# Patient Record
Sex: Male | Born: 1965 | Race: Black or African American | Hispanic: No | Marital: Single | Smoking: Current every day smoker
Health system: Southern US, Community
[De-identification: ages and names within clinical notes are randomized; demographics above are authoritative.]

## PROBLEM LIST (undated history)

## (undated) DIAGNOSIS — B20 Human immunodeficiency virus [HIV] disease: Secondary | ICD-10-CM

---

## 2006-01-19 ENCOUNTER — Encounter (INDEPENDENT_AMBULATORY_CARE_PROVIDER_SITE_OTHER): Payer: Self-pay | Admitting: *Deleted

## 2006-01-19 ENCOUNTER — Encounter: Admission: RE | Admit: 2006-01-19 | Discharge: 2006-01-19 | Payer: Self-pay | Admitting: Internal Medicine

## 2006-01-19 ENCOUNTER — Ambulatory Visit: Payer: Self-pay | Admitting: Internal Medicine

## 2006-02-22 ENCOUNTER — Ambulatory Visit: Payer: Self-pay | Admitting: Internal Medicine

## 2006-03-03 DIAGNOSIS — B20 Human immunodeficiency virus [HIV] disease: Secondary | ICD-10-CM

## 2006-03-22 ENCOUNTER — Encounter (INDEPENDENT_AMBULATORY_CARE_PROVIDER_SITE_OTHER): Payer: Self-pay | Admitting: *Deleted

## 2006-03-22 ENCOUNTER — Ambulatory Visit: Payer: Self-pay | Admitting: Internal Medicine

## 2006-03-22 ENCOUNTER — Encounter: Admission: RE | Admit: 2006-03-22 | Discharge: 2006-03-22 | Payer: Self-pay | Admitting: Internal Medicine

## 2006-03-22 LAB — CONVERTED CEMR LAB
CD4 Count: 410 microliters
HIV 1 RNA Quant: 157 copies/mL

## 2006-04-05 ENCOUNTER — Ambulatory Visit: Payer: Self-pay | Admitting: Internal Medicine

## 2006-05-18 ENCOUNTER — Encounter: Payer: Self-pay | Admitting: Internal Medicine

## 2006-05-18 ENCOUNTER — Encounter (INDEPENDENT_AMBULATORY_CARE_PROVIDER_SITE_OTHER): Payer: Self-pay | Admitting: Infectious Diseases

## 2006-06-13 ENCOUNTER — Encounter: Payer: Self-pay | Admitting: Internal Medicine

## 2006-06-21 ENCOUNTER — Encounter: Admission: RE | Admit: 2006-06-21 | Discharge: 2006-06-21 | Payer: Self-pay | Admitting: Internal Medicine

## 2006-06-21 ENCOUNTER — Encounter (INDEPENDENT_AMBULATORY_CARE_PROVIDER_SITE_OTHER): Payer: Self-pay | Admitting: *Deleted

## 2006-06-21 ENCOUNTER — Ambulatory Visit: Payer: Self-pay | Admitting: Internal Medicine

## 2006-06-21 LAB — CONVERTED CEMR LAB
ALT: 13 units/L (ref 0–53)
Basophils Absolute: 0 10*3/uL (ref 0.0–0.1)
CD4 Count: 550 microliters
CO2: 28 meq/L (ref 19–32)
Calcium: 8.8 mg/dL (ref 8.4–10.5)
Chloride: 103 meq/L (ref 96–112)
Creatinine, Ser: 0.88 mg/dL (ref 0.40–1.50)
Eosinophils Relative: 3 % (ref 0–5)
Glucose, Bld: 84 mg/dL (ref 70–99)
HCT: 39.5 % (ref 39.0–52.0)
HIV 1 RNA Quant: <50 copies/mL (ref <50)
HIV-1 RNA Quant, Log: <1.7 (ref <1.70)
Hemoglobin: 13.3 g/dL (ref 13.0–17.0)
Lymphocytes Relative: 45 % (ref 12–46)
Lymphs Abs: 2.8 10*3/uL (ref 0.7–3.3)
Monocytes Absolute: 0.9 10*3/uL — ABNORMAL HIGH (ref 0.2–0.7)
Monocytes Relative: 15 % — ABNORMAL HIGH (ref 3–11)
Neutro Abs: 2.3 10*3/uL (ref 1.7–7.7)
RBC: 4.39 M/uL (ref 4.22–5.81)
Total Bilirubin: 0.4 mg/dL (ref 0.3–1.2)
WBC: 6.3 10*3/uL (ref 4.0–10.5)

## 2006-07-10 ENCOUNTER — Encounter (INDEPENDENT_AMBULATORY_CARE_PROVIDER_SITE_OTHER): Payer: Self-pay | Admitting: *Deleted

## 2006-07-10 LAB — CONVERTED CEMR LAB

## 2006-07-21 ENCOUNTER — Telehealth (INDEPENDENT_AMBULATORY_CARE_PROVIDER_SITE_OTHER): Payer: Self-pay | Admitting: Infectious Diseases

## 2006-07-23 ENCOUNTER — Encounter (INDEPENDENT_AMBULATORY_CARE_PROVIDER_SITE_OTHER): Payer: Self-pay | Admitting: *Deleted

## 2006-08-07 ENCOUNTER — Telehealth: Payer: Self-pay | Admitting: Internal Medicine

## 2006-08-22 ENCOUNTER — Telehealth: Payer: Self-pay | Admitting: Internal Medicine

## 2006-09-19 ENCOUNTER — Telehealth: Payer: Self-pay | Admitting: Internal Medicine

## 2006-10-04 ENCOUNTER — Ambulatory Visit: Payer: Self-pay | Admitting: Internal Medicine

## 2006-10-04 ENCOUNTER — Encounter: Admission: RE | Admit: 2006-10-04 | Discharge: 2006-10-04 | Payer: Self-pay | Admitting: Internal Medicine

## 2006-10-04 LAB — CONVERTED CEMR LAB
ALT: 9 units/L (ref 0–53)
AST: 12 units/L (ref 0–37)
Albumin: 4.5 g/dL (ref 3.5–5.2)
Alkaline Phosphatase: 57 units/L (ref 39–117)
Basophils Relative: 1 % (ref 0–1)
Calcium: 9.4 mg/dL (ref 8.4–10.5)
Chloride: 103 meq/L (ref 96–112)
Eosinophils Absolute: 0.2 10*3/uL (ref 0.0–0.7)
MCHC: 34.6 g/dL (ref 30.0–36.0)
MCV: 88.7 fL (ref 78.0–100.0)
Neutro Abs: 1.6 10*3/uL — ABNORMAL LOW (ref 1.7–7.7)
Neutrophils Relative %: 37 % — ABNORMAL LOW (ref 43–77)
Platelets: 217 10*3/uL (ref 150–400)
Potassium: 4.2 meq/L (ref 3.5–5.3)
RBC: 4.5 M/uL (ref 4.22–5.81)
Sodium: 138 meq/L (ref 135–145)
Total Protein: 8.5 g/dL — ABNORMAL HIGH (ref 6.0–8.3)
WBC: 4.5 10*3/uL (ref 4.0–10.5)

## 2006-10-16 ENCOUNTER — Encounter (INDEPENDENT_AMBULATORY_CARE_PROVIDER_SITE_OTHER): Payer: Self-pay | Admitting: *Deleted

## 2006-10-18 ENCOUNTER — Ambulatory Visit: Payer: Self-pay | Admitting: Internal Medicine

## 2006-10-18 DIAGNOSIS — M549 Dorsalgia, unspecified: Secondary | ICD-10-CM | POA: Insufficient documentation

## 2006-10-19 ENCOUNTER — Telehealth: Payer: Self-pay | Admitting: Internal Medicine

## 2006-11-16 ENCOUNTER — Telehealth: Payer: Self-pay | Admitting: Internal Medicine

## 2006-12-15 ENCOUNTER — Telehealth: Payer: Self-pay | Admitting: Internal Medicine

## 2006-12-22 ENCOUNTER — Encounter: Payer: Self-pay | Admitting: Internal Medicine

## 2007-01-17 ENCOUNTER — Telehealth: Payer: Self-pay | Admitting: Internal Medicine

## 2007-02-01 ENCOUNTER — Telehealth: Payer: Self-pay | Admitting: Internal Medicine

## 2007-02-21 ENCOUNTER — Telehealth: Payer: Self-pay | Admitting: Internal Medicine

## 2007-03-06 ENCOUNTER — Encounter: Admission: RE | Admit: 2007-03-06 | Discharge: 2007-03-06 | Payer: Self-pay | Admitting: Internal Medicine

## 2007-03-06 ENCOUNTER — Ambulatory Visit: Payer: Self-pay | Admitting: Internal Medicine

## 2007-03-06 LAB — CONVERTED CEMR LAB
AST: 13 units/L (ref 0–37)
BUN: 9 mg/dL (ref 6–23)
Basophils Relative: 1 % (ref 0–1)
Calcium: 9.2 mg/dL (ref 8.4–10.5)
Chloride: 103 meq/L (ref 96–112)
Creatinine, Ser: 0.81 mg/dL (ref 0.40–1.50)
Eosinophils Absolute: 0.2 10*3/uL (ref 0.0–0.7)
Eosinophils Relative: 5 % (ref 0–5)
HCT: 40 % (ref 39.0–52.0)
Hemoglobin: 13.7 g/dL (ref 13.0–17.0)
MCHC: 34.3 g/dL (ref 30.0–36.0)
MCV: 88.9 fL (ref 78.0–100.0)
Monocytes Absolute: 0.5 10*3/uL (ref 0.2–0.7)
Monocytes Relative: 11 % (ref 3–11)
Neutro Abs: 2.1 10*3/uL (ref 1.7–7.7)
RBC: 4.5 M/uL (ref 4.22–5.81)
Total Bilirubin: 0.7 mg/dL (ref 0.3–1.2)

## 2007-03-19 ENCOUNTER — Telehealth: Payer: Self-pay | Admitting: Internal Medicine

## 2007-03-21 ENCOUNTER — Ambulatory Visit: Payer: Self-pay | Admitting: Internal Medicine

## 2007-03-21 DIAGNOSIS — F341 Dysthymic disorder: Secondary | ICD-10-CM

## 2007-03-23 ENCOUNTER — Telehealth: Payer: Self-pay | Admitting: Internal Medicine

## 2007-04-18 ENCOUNTER — Telehealth: Payer: Self-pay | Admitting: Internal Medicine

## 2007-05-15 ENCOUNTER — Encounter: Payer: Self-pay | Admitting: Internal Medicine

## 2007-05-25 ENCOUNTER — Telehealth: Payer: Self-pay | Admitting: Internal Medicine

## 2007-06-21 ENCOUNTER — Telehealth: Payer: Self-pay | Admitting: Internal Medicine

## 2007-07-03 ENCOUNTER — Encounter (INDEPENDENT_AMBULATORY_CARE_PROVIDER_SITE_OTHER): Payer: Self-pay | Admitting: *Deleted

## 2007-07-19 ENCOUNTER — Telehealth: Payer: Self-pay | Admitting: Internal Medicine

## 2007-08-31 ENCOUNTER — Telehealth (INDEPENDENT_AMBULATORY_CARE_PROVIDER_SITE_OTHER): Payer: Self-pay | Admitting: *Deleted

## 2007-09-10 ENCOUNTER — Telehealth: Payer: Self-pay | Admitting: Internal Medicine

## 2008-02-07 ENCOUNTER — Telehealth: Payer: Self-pay | Admitting: Internal Medicine

## 2008-06-06 ENCOUNTER — Telehealth (INDEPENDENT_AMBULATORY_CARE_PROVIDER_SITE_OTHER): Payer: Self-pay | Admitting: *Deleted

## 2008-08-26 ENCOUNTER — Ambulatory Visit: Payer: Self-pay | Admitting: Internal Medicine

## 2008-08-26 ENCOUNTER — Encounter (INDEPENDENT_AMBULATORY_CARE_PROVIDER_SITE_OTHER): Payer: Self-pay | Admitting: Licensed Clinical Social Worker

## 2008-08-26 LAB — CONVERTED CEMR LAB
AST: 14 units/L (ref 0–37)
Albumin: 4 g/dL (ref 3.5–5.2)
Alkaline Phosphatase: 51 units/L (ref 39–117)
Basophils Absolute: 0.1 10*3/uL (ref 0.0–0.1)
Eosinophils Absolute: 0.4 10*3/uL (ref 0.0–0.7)
Eosinophils Relative: 11 % — ABNORMAL HIGH (ref 0–5)
GFR calc non Af Amer: >60 mL/min (ref >60)
Glucose, Bld: 82 mg/dL (ref 70–99)
HCT: 38.6 % — ABNORMAL LOW (ref 39.0–52.0)
HIV-1 RNA Quant, Log: 3.09 — ABNORMAL HIGH (ref <1.68)
LDL Cholesterol: 62 mg/dL (ref 0–99)
MCV: 85.6 fL (ref 78.0–100.0)
Platelets: 147 10*3/uL — ABNORMAL LOW (ref 150–400)
Potassium: 4 meq/L (ref 3.5–5.3)
RDW: 14.7 % (ref 11.5–15.5)
Sodium: 138 meq/L (ref 135–145)
Total Protein: 7.6 g/dL (ref 6.0–8.3)

## 2008-09-09 ENCOUNTER — Ambulatory Visit: Payer: Self-pay | Admitting: Internal Medicine

## 2008-09-16 ENCOUNTER — Telehealth (INDEPENDENT_AMBULATORY_CARE_PROVIDER_SITE_OTHER): Payer: Self-pay | Admitting: *Deleted

## 2008-09-16 ENCOUNTER — Encounter (INDEPENDENT_AMBULATORY_CARE_PROVIDER_SITE_OTHER): Payer: Self-pay | Admitting: *Deleted

## 2008-11-26 ENCOUNTER — Ambulatory Visit: Payer: Self-pay | Admitting: Internal Medicine

## 2008-11-26 DIAGNOSIS — L738 Other specified follicular disorders: Secondary | ICD-10-CM

## 2008-11-26 LAB — CONVERTED CEMR LAB
ALT: 10 units/L (ref 0–53)
Alkaline Phosphatase: 51 units/L (ref 39–117)
Basophils Relative: 1 % (ref 0–1)
Eosinophils Absolute: 0.4 10*3/uL (ref 0.0–0.7)
Eosinophils Relative: 8 % — ABNORMAL HIGH (ref 0–5)
Glucose, Bld: 79 mg/dL (ref 70–99)
MCHC: 34.2 g/dL (ref 30.0–36.0)
MCV: 84.9 fL (ref 78.0–100.0)
Monocytes Relative: 6 % (ref 3–12)
Neutrophils Relative %: 35 % — ABNORMAL LOW (ref 43–77)
RBC: 4.17 M/uL — ABNORMAL LOW (ref 4.22–5.81)
Sodium: 140 meq/L (ref 135–145)
Total Bilirubin: 0.3 mg/dL (ref 0.3–1.2)
Total Protein: 7.3 g/dL (ref 6.0–8.3)

## 2008-12-10 ENCOUNTER — Ambulatory Visit: Payer: Self-pay | Admitting: Internal Medicine

## 2008-12-30 ENCOUNTER — Ambulatory Visit: Payer: Self-pay | Admitting: Internal Medicine

## 2009-02-10 ENCOUNTER — Ambulatory Visit: Payer: Self-pay | Admitting: Internal Medicine

## 2009-02-10 LAB — CONVERTED CEMR LAB
Albumin: 3.7 g/dL (ref 3.5–5.2)
BUN: 8 mg/dL (ref 6–23)
CO2: 24 meq/L (ref 19–32)
Calcium: 8.6 mg/dL (ref 8.4–10.5)
Chloride: 108 meq/L (ref 96–112)
Creatinine, Ser: 0.86 mg/dL (ref 0.40–1.50)
Eosinophils Absolute: 0.6 10*3/uL (ref 0.0–0.7)
Eosinophils Relative: 11 % — ABNORMAL HIGH (ref 0–5)
Glucose, Bld: 87 mg/dL (ref 70–99)
HCT: 34 % — ABNORMAL LOW (ref 39.0–52.0)
HIV 1 RNA Quant: 60 copies/mL — ABNORMAL HIGH (ref <48)
Hemoglobin: 11.7 g/dL — ABNORMAL LOW (ref 13.0–17.0)
Lymphs Abs: 2.1 10*3/uL (ref 0.7–4.0)
MCV: 84.4 fL (ref >=78.0)
Monocytes Absolute: 0.5 10*3/uL (ref 0.1–1.0)
Monocytes Relative: 11 % (ref 3–12)
Platelets: 171 10*3/uL (ref 150–400)
Potassium: 3.9 meq/L (ref 3.5–5.3)
RBC: 4.03 M/uL — ABNORMAL LOW (ref 4.22–5.81)
WBC: 4.8 10*3/uL (ref 4.0–10.5)

## 2009-02-24 ENCOUNTER — Ambulatory Visit: Payer: Self-pay | Admitting: Internal Medicine

## 2009-06-18 ENCOUNTER — Ambulatory Visit: Payer: Self-pay | Admitting: Internal Medicine

## 2009-06-18 LAB — CONVERTED CEMR LAB
ALT: <8 units/L (ref 0–53)
AST: 12 units/L (ref 0–37)
Basophils Relative: 1 % (ref 0–1)
CO2: 27 meq/L (ref 19–32)
Creatinine, Ser: 0.83 mg/dL (ref 0.40–1.50)
Eosinophils Absolute: 0.3 10*3/uL (ref 0.0–0.7)
HIV 1 RNA Quant: 66 copies/mL — ABNORMAL HIGH (ref <48)
Lymphs Abs: 2.2 10*3/uL (ref 0.7–4.0)
MCHC: 35.4 g/dL (ref 30.0–36.0)
Monocytes Relative: 7 % (ref 3–12)
Neutro Abs: 2.6 10*3/uL (ref 1.7–7.7)
Neutrophils Relative %: 47 % (ref 43–77)
Platelets: 165 10*3/uL (ref 150–400)
RBC: 4.32 M/uL (ref 4.22–5.81)
Sodium: 139 meq/L (ref 135–145)
Total Bilirubin: 0.4 mg/dL (ref 0.3–1.2)
Total Protein: 7.9 g/dL (ref 6.0–8.3)
WBC: 5.6 10*3/uL (ref 4.0–10.5)

## 2009-07-03 ENCOUNTER — Encounter: Payer: Self-pay | Admitting: Internal Medicine

## 2009-07-07 ENCOUNTER — Ambulatory Visit: Payer: Self-pay | Admitting: Internal Medicine

## 2009-07-24 ENCOUNTER — Encounter: Payer: Self-pay | Admitting: Internal Medicine

## 2009-08-24 ENCOUNTER — Encounter (INDEPENDENT_AMBULATORY_CARE_PROVIDER_SITE_OTHER): Payer: Self-pay | Admitting: *Deleted

## 2009-11-05 ENCOUNTER — Ambulatory Visit: Payer: Self-pay | Admitting: Internal Medicine

## 2009-11-05 LAB — CONVERTED CEMR LAB
Alkaline Phosphatase: 49 units/L (ref 39–117)
BUN: 11 mg/dL (ref 6–23)
Basophils Absolute: 0 10*3/uL (ref 0.0–0.1)
Basophils Relative: 1 % (ref 0–1)
CO2: 23 meq/L (ref 19–32)
Creatinine, Ser: 0.83 mg/dL (ref 0.40–1.50)
Eosinophils Absolute: 0.4 10*3/uL (ref 0.0–0.7)
Eosinophils Relative: 7 % — ABNORMAL HIGH (ref 0–5)
Glucose, Bld: 107 mg/dL — ABNORMAL HIGH (ref 70–99)
HCT: 35.1 % — ABNORMAL LOW (ref 39.0–52.0)
HIV 1 RNA Quant: <48 copies/mL (ref <48)
HIV-1 RNA Quant, Log: <1.68 (ref <1.68)
Hemoglobin: 12 g/dL — ABNORMAL LOW (ref 13.0–17.0)
MCHC: 34.2 g/dL (ref 30.0–36.0)
MCV: 88.4 fL (ref 78.0–100.0)
Monocytes Absolute: 0.4 10*3/uL (ref 0.1–1.0)
Monocytes Relative: 7 % (ref 3–12)
RBC: 3.97 M/uL — ABNORMAL LOW (ref 4.22–5.81)
RDW: 14.7 % (ref 11.5–15.5)
Sodium: 138 meq/L (ref 135–145)
Total Bilirubin: 0.6 mg/dL (ref 0.3–1.2)
Total Protein: 7 g/dL (ref 6.0–8.3)

## 2009-11-25 ENCOUNTER — Encounter (INDEPENDENT_AMBULATORY_CARE_PROVIDER_SITE_OTHER): Payer: Self-pay | Admitting: *Deleted

## 2009-11-25 ENCOUNTER — Ambulatory Visit: Payer: Self-pay | Admitting: Internal Medicine

## 2009-11-26 ENCOUNTER — Telehealth (INDEPENDENT_AMBULATORY_CARE_PROVIDER_SITE_OTHER): Payer: Self-pay | Admitting: *Deleted

## 2010-02-05 ENCOUNTER — Encounter: Payer: Self-pay | Admitting: Internal Medicine

## 2010-03-23 ENCOUNTER — Ambulatory Visit: Payer: Self-pay | Admitting: Internal Medicine

## 2010-03-23 LAB — CONVERTED CEMR LAB
ALT: <8 units/L (ref 0–53)
AST: 11 units/L (ref 0–37)
Basophils Absolute: 0 10*3/uL (ref 0.0–0.1)
Calcium: 9.2 mg/dL (ref 8.4–10.5)
Chloride: 105 meq/L (ref 96–112)
Creatinine, Ser: 0.89 mg/dL (ref 0.40–1.50)
Eosinophils Absolute: 0.5 10*3/uL (ref 0.0–0.7)
Eosinophils Relative: 9 % — ABNORMAL HIGH (ref 0–5)
HCT: 35.9 % — ABNORMAL LOW (ref 39.0–52.0)
HIV 1 RNA Quant: <20 copies/mL (ref <20)
HIV-1 RNA Quant, Log: <1.3 (ref <1.30)
MCV: 87.8 fL (ref 78.0–100.0)
Neutrophils Relative %: 35 % — ABNORMAL LOW (ref 43–77)
Platelets: 147 10*3/uL — ABNORMAL LOW (ref 150–400)
RDW: 14.8 % (ref 11.5–15.5)
Sodium: 140 meq/L (ref 135–145)
Total Bilirubin: 0.4 mg/dL (ref 0.3–1.2)
Total Protein: 6.9 g/dL (ref 6.0–8.3)

## 2010-05-03 ENCOUNTER — Encounter (INDEPENDENT_AMBULATORY_CARE_PROVIDER_SITE_OTHER): Payer: Self-pay | Admitting: *Deleted

## 2010-06-15 NOTE — Letter (Signed)
Summary: Dennis Mathis: Income Verification  Dennis Mathis: Income Verification   Imported By: Florinda Marker 08/06/2009 16:05:02  _____________________________________________________________________  External Attachment:    Type:   Image     Comment:   External Document

## 2010-06-15 NOTE — Progress Notes (Signed)
Summary: RW form not complete  Phone Note Outgoing Call   Call placed by: Paulo Fruit  BS,CPht II,MPH,  November 26, 2009 2:41 PM Call placed to: Patient Reason for Call: Get patient information Details for Reason: discrepancy in SSN Summary of Call: Called patient to verify his SSN.  The number he wrote on the RW form is totatlly different from the one in his chart.  Patient said the 145---SSN is the one he uses in [place].  Patient read off the SSN he wrote on the form.  I asked patient to bring in his SSN card with the correct number.  At the present time I am unable to give him a RW card until this is rectified. Initial call taken by: Paulo Fruit  BS,CPht II,MPH,  November 26, 2009 2:45 PM     Appended Document: RW form not complete Patient came in today to bring his Social Security Card since there were discrepancies in his SSN.  The correct number was entired in [employer] by the manager of the clinic.  A copy of his card will be scanned in the EMR for the future.  RW application is now complete.  Patient should receive his RW card in a few days.

## 2010-06-15 NOTE — Assessment & Plan Note (Signed)
Summary: CHECKUP/SB.   CC:  follow-up visit.  History of Present Illness: Pt here for f/u.  He has been feeling well.  No missed doses of his HIV meds.  Preventive Screening-Counseling & Management  Alcohol-Tobacco     Alcohol drinks/day: on weekends      Alcohol type: beer     Smoking Status: current     Packs/Day: 2 cigs     Cigars/week: 1-2  Caffeine-Diet-Exercise     Caffeine use/day: soda     Does Patient Exercise: yes     Type of exercise: walking     Exercise (avg: min/session): 30-60     Times/week: 3  Hep-HIV-STD-Contraception     HIV Risk: risk noted  Safety-Violence-Falls     Seat Belt Use: yes   Updated Prior Medication List: TRIAMCINOLONE ACETONIDE 0.1 % CREA (TRIAMCINOLONE ACETONIDE) apply two times a day as needed TRUVADA 200-300 MG TABS (EMTRICITABINE-TENOFOVIR) Take 1 tablet by mouth once a day ISENTRESS 400 MG TABS (RALTEGRAVIR POTASSIUM) Take 1 tablet by mouth two times a day VICODIN 5-500 MG TABS (HYDROCODONE-ACETAMINOPHEN) Take 1 tablet by mouth every 6 hours as needed  Current Allergies (reviewed today): No known allergies  Review of Systems  The patient denies anorexia, fever, and weight loss.    Vital Signs:  Patient profile:   45 year old male Height:      67 inches (170.18 cm) Weight:      129.4 pounds (58.82 kg) BMI:     20.34 Temp:     97.3 degrees F oral Pulse rate:   66 / minute BP sitting:   102 / 68  (left arm)  Vitals Entered By: Wendall Mola CMA Duncan Dull) (July 07, 2009 3:49 PM) CC: follow-up visit Is Patient Diabetic? No Pain Assessment Patient in pain? no      Nutritional Status BMI of 19 -24 = normal Nutritional Status Detail appetite is "so-so" per patient  Does patient need assistance? Functional Status Self care Ambulation Normal   Physical Exam  General:  alert, well-developed, well-nourished, and well-hydrated.   Head:  normocephalic and atraumatic.   Mouth:  pharynx pink and moist.   Lungs:   normal breath sounds.      Impression & Recommendations:  Problem # 1:  HIV DISEASE (ICD-042) Pt.s most recent CD4ct was 630 and VL 66 .  Pt instructed to continue the current antiretroviral regimen.  Pt encouraged to take medication regularly and not miss doses.  Pt will f/u in 3 months for repeat blood work and will see me 2 weeks later.  Diagnostics Reviewed:  HIV: CDC-defined AIDS (07/03/2009)   CD4: 630 (06/19/2009)   WBC: 5.6 (06/18/2009)   Hgb: 13.3 (06/18/2009)   HCT: 37.6 (06/18/2009)   Platelets: 165 (06/18/2009) HIV genotype: * (12/10/2008)   HIV-1 RNA: 66 (06/18/2009)   HBSAg: NO (07/10/2006)  Other Orders: Est. Patient Level III (16109) Future Orders: T-CD4SP ([facility]) (CD4SP) ... 10/05/2009 T-HIV Viral Load [id number]) ... 10/05/2009 T-Comprehensive Metabolic Panel [id number]) ... 10/05/2009 T-CBC w/Diff ([id number]) ... 10/05/2009  Patient Instructions: 1)  Please schedule a follow-up appointment in 3 months, 2 weeks after labs.  Process Orders Check Orders Results:     [facility]: ABN not required for this insurance Tests Sent for requisitioning (July 07, 2009 4:05 PM):     10/05/2009: [facility] -- T-HIV Viral Load [id number] (signed)     10/05/2009: [facility] -- T-Comprehensive Metabolic Panel [[id number]] (signed)     10/05/2009:  [facility] -- [facility] w/Diff [phone] (signed)

## 2010-06-15 NOTE — Miscellaneous (Signed)
  Clinical Lists Changes  Orders: Added new Test order of T-CBC w/Diff [id number]) - Signed Added new Test order of T-Comprehensive Metabolic Panel [phone]) - Signed Added new Test order of T-HIV Viral Load [id number]) - Signed Added new Test order of T-CD4SP [facility]) (CD4SP) - Signed

## 2010-06-15 NOTE — Assessment & Plan Note (Signed)
Summary: f/u [mkj]   CC:  follow-up visit, lab results, and c/o depression.  History of Present Illness: Pt here for f/u.  He has been sruggling with depression because 7/4 is the anniversary of when his partner died and he found out she was HIV positive and then was tested and found out he was positive. He has a good support system and does not feel he needs counseling.  He would like to get bakc on antidepressants.  Depression History:      The patient denies a depressed mood most of the day and a diminished interest in his usual daily activities.        The patient denies that he feels like life is not worth living, denies that he wishes that he were dead, and denies that he has thought about ending his life.        Preventive Screening-Counseling & Management  Alcohol-Tobacco     Alcohol drinks/day: on weekends      Alcohol type: beer     Smoking Status: current     Packs/Day: 2 cigs     Cigars/week: 1-2  Caffeine-Diet-Exercise     Caffeine use/day: soda 3 per day     Does Patient Exercise: yes     Type of exercise: walking     Exercise (avg: min/session): 30-60     Times/week: 3  Hep-HIV-STD-Contraception     HIV Risk: risk noted  Safety-Violence-Falls     Seat Belt Use: yes      Sexual History:  currently monogamous.        Drug Use:  never.        Blood Transfusions:  no.        Travel History:  no.    Comments: pt. given condoms   Updated Prior Medication List: TRIAMCINOLONE ACETONIDE 0.1 % CREA (TRIAMCINOLONE ACETONIDE) apply two times a day as needed TRUVADA 200-300 MG TABS (EMTRICITABINE-TENOFOVIR) Take 1 tablet by mouth once a day ISENTRESS 400 MG TABS (RALTEGRAVIR POTASSIUM) Take 1 tablet by mouth two times a day VICODIN 5-500 MG TABS (HYDROCODONE-ACETAMINOPHEN) Take 1 tablet by mouth every 6 hours as needed ZOLOFT 50 MG TABS (SERTRALINE HCL) Take 1 tablet by mouth once a day  Current Allergies (reviewed today): No known allergies  Past  History:  Past Medical History: Last updated: 03/03/2006 HIV disease  Review of Systems  The patient denies anorexia, fever, and weight loss.    Vital Signs:  Patient profile:   45 year old male Height:      67 inches (170.18 cm) Weight:      128.0 pounds (58.18 kg) BMI:     20.12 Temp:     98.0 degrees F (36.67 degrees C) oral Pulse rate:   68 / minute BP sitting:   130 / 75  (right arm)  Vitals Entered By: Wendall Mola CMA Duncan Dull) (November 25, 2009 3:40 PM) CC: follow-up visit, lab results, c/o depression Is Patient Diabetic? No Pain Assessment Patient in pain? no      Nutritional Status BMI of 19 -24 = normal Nutritional Status Detail appetite "so-so"  Does patient need assistance? Functional Status Self care Ambulation Normal Comments pt. missed two doses of HAART meds since last visit   Physical Exam  General:  alert, well-developed, well-nourished, and well-hydrated.   Head:  normocephalic and atraumatic.   Lungs:  normal breath sounds.      Impression & Recommendations:  Problem # 1:  HIV DISEASE (ICD-042) Pt.s most  recent CD4ct was 610 and VL <48 .  Pt instructed to continue the current antiretroviral regimen.  Pt encouraged to take medication regularly and not miss doses.  Pt will f/u in 3 months for repeat blood work and will see me 2 weeks later.  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 Take 1 tablet by mouth once a day  Other Orders: Est. Patient Level III (16109) Future Orders: T-CD4SP ([facility]) (CD4SP) ... 02/23/2010 T-HIV Viral Load  [id number]) ... 02/23/2010 T-Comprehensive Metabolic Panel [id number]) ... 02/23/2010 T-CBC w/Diff ([id number]) ... 02/23/2010 T-RPR (Syphilis) [phone]) ... 02/23/2010 T-Lipid Profile [id number]) ... 02/23/2010  Patient Instructions: 1)  Please schedule a follow-up appointment in 3 months, 2weeks after labs.  Prescriptions: ZOLOFT 50 MG TABS (SERTRALINE HCL) Take 1 tablet by mouth once a day  #30 x 5   Entered and Authorized by:   Yisroel Ramming MD   Signed by:   Yisroel Ramming MD on 11/25/2009   Method used:   Print then Give to Patient   RxID:   [id number]

## 2010-06-15 NOTE — Miscellaneous (Signed)
Summary: RW Update  Clinical Lists Changes  Observations: Added new observation of YEARAIDSPOS: 2010  (07/03/2009 15:29) Added new observation of HIV STATUS: CDC-defined AIDS  (07/03/2009 15:29)

## 2010-06-15 NOTE — Miscellaneous (Signed)
Summary: clinical update/ryan white  Clinical Lists Changes  Observations: Added new observation of HOUSEINCOME: 0  (11/25/2009 16:24) Added new observation of FINASSESSDT: 11/25/2009  (11/25/2009 16:24)

## 2010-06-15 NOTE — Miscellaneous (Signed)
Summary: clinical update/ryan white NCADAP apprv til 08/14/10  Clinical Lists Changes  Observations: Added new observation of AIDSDAP: Yes 2011 (08/24/2009 11:42)

## 2010-06-15 NOTE — Miscellaneous (Signed)
Summary: [facility]   [facility]   Imported By: Florinda Marker 02/08/2010 11:09:25  _____________________________________________________________________  External Attachment:    Type:   Image     Comment:   External Document

## 2010-06-17 NOTE — Miscellaneous (Signed)
  Clinical Lists Changes 

## 2010-07-05 ENCOUNTER — Encounter (INDEPENDENT_AMBULATORY_CARE_PROVIDER_SITE_OTHER): Payer: Self-pay | Admitting: *Deleted

## 2010-07-13 NOTE — Miscellaneous (Signed)
  Clinical Lists Changes 

## 2010-07-28 ENCOUNTER — Encounter (INDEPENDENT_AMBULATORY_CARE_PROVIDER_SITE_OTHER): Payer: Self-pay | Admitting: *Deleted

## 2010-08-03 NOTE — Miscellaneous (Signed)
Summary: ADAP UPDATE   Clinical Lists Changes  Observations: Added new observation of PCTFPL: 0  (07/28/2010 17:35) Added new observation of AIDSDAP: Pending-APPROVAL 2012  (07/28/2010 17:35) Added new observation of FINASSESSDT: 07/28/2010  (07/28/2010 17:35)

## 2010-08-03 NOTE — Miscellaneous (Signed)
  Clinical Lists Changes 

## 2010-08-04 LAB — T-HELPER CELL (CD4) - (RCID CLINIC ONLY): CD4 % Helper T Cell: 27 % — ABNORMAL LOW (ref 33–55)

## 2010-08-18 ENCOUNTER — Other Ambulatory Visit: Payer: Self-pay

## 2010-08-20 LAB — T-HELPER CELL (CD4) - (RCID CLINIC ONLY)
CD4 % Helper T Cell: 25 % — ABNORMAL LOW (ref 33–55)
CD4 T Cell Abs: 580 uL (ref 400–2700)

## 2010-08-22 LAB — T-HELPER CELL (CD4) - (RCID CLINIC ONLY)
CD4 % Helper T Cell: 23 % — ABNORMAL LOW (ref 33–55)
CD4 T Cell Abs: 560 uL (ref 400–2700)

## 2010-09-01 ENCOUNTER — Ambulatory Visit: Payer: Self-pay | Admitting: Adult Health

## 2011-01-06 ENCOUNTER — Other Ambulatory Visit: Payer: Self-pay | Admitting: *Deleted

## 2011-01-06 DIAGNOSIS — B20 Human immunodeficiency virus [HIV] disease: Secondary | ICD-10-CM

## 2011-01-06 MED ORDER — EMTRICITABINE-TENOFOVIR DF 200-300 MG PO TABS: 1.0000 | ORAL_TABLET | Freq: Every day | ORAL | Status: DC

## 2011-01-06 MED ORDER — RALTEGRAVIR POTASSIUM 400 MG PO TABS: 400.0000 mg | ORAL_TABLET | Freq: Two times a day (BID) | ORAL | Status: DC

## 2011-02-03 ENCOUNTER — Other Ambulatory Visit: Payer: Self-pay | Admitting: *Deleted

## 2011-02-03 DIAGNOSIS — B20 Human immunodeficiency virus [HIV] disease: Secondary | ICD-10-CM

## 2011-02-03 MED ORDER — EMTRICITABINE-TENOFOVIR DF 200-300 MG PO TABS: 1.0000 | ORAL_TABLET | Freq: Every day | ORAL | Status: DC

## 2011-02-03 MED ORDER — RALTEGRAVIR POTASSIUM 400 MG PO TABS: 400.0000 mg | ORAL_TABLET | Freq: Two times a day (BID) | ORAL | Status: DC

## 2011-02-03 NOTE — Telephone Encounter (Signed)
Pt has not seen medical provider since 7/11.  Last labs were drawn 03/23/10 but not reviewed with pt.  Attempted to call the pt but tel # listed is out-of-order.  Pt needs to make and keep lab and provider visits.  Will only refill for one month.  RN spoke with [facility].  They had made a telephone outreach to the pt.  ADAP certification runs out at the end of Sept. 2012.  RN shared with the pharmacist that the pt has not been seen in the office since 11/2009.  RN asked that the pt be given a message to call this office to schedule lab and MD visits ASAP and that this office would not be able to continue refills with seeing the pt.  [facility] stated they would share the message.   Jennet Maduro, RN

## 2011-02-10 ENCOUNTER — Telehealth: Payer: Self-pay | Admitting: Infectious Diseases

## 2011-02-10 NOTE — Telephone Encounter (Signed)
Called patient to renew adap and cell phone is not valid, also home phone disconnected - could not reach him.

## 2011-02-23 LAB — T-HELPER CELL (CD4) - (RCID CLINIC ONLY)
CD4 % Helper T Cell: 22 — ABNORMAL LOW
CD4 T Cell Abs: 420

## 2011-08-15 ENCOUNTER — Other Ambulatory Visit (INDEPENDENT_AMBULATORY_CARE_PROVIDER_SITE_OTHER): Payer: Self-pay

## 2011-08-15 DIAGNOSIS — B20 Human immunodeficiency virus [HIV] disease: Secondary | ICD-10-CM

## 2011-08-15 DIAGNOSIS — Z79899 Other long term (current) drug therapy: Secondary | ICD-10-CM

## 2011-08-15 DIAGNOSIS — Z113 Encounter for screening for infections with a predominantly sexual mode of transmission: Secondary | ICD-10-CM

## 2011-08-15 LAB — COMPLETE METABOLIC PANEL WITH GFR
BUN: 15 mg/dL (ref 6–23)
CO2: 26 mEq/L (ref 19–32)
Creat: 0.86 mg/dL (ref 0.50–1.35)
GFR, Est African American: >89 mL/min
GFR, Est Non African American: >89 mL/min
Glucose, Bld: 92 mg/dL (ref 70–99)
Total Bilirubin: 0.4 mg/dL (ref 0.3–1.2)
Total Protein: 7.3 g/dL (ref 6.0–8.3)

## 2011-08-15 LAB — URINALYSIS, ROUTINE W REFLEX MICROSCOPIC
Bilirubin Urine: NEGATIVE
Nitrite: NEGATIVE
Protein, ur: NEGATIVE mg/dL
Urobilinogen, UA: 1 mg/dL (ref 0.0–1.0)

## 2011-08-15 LAB — LIPID PANEL
Cholesterol: 133 mg/dL (ref 0–200)
Total CHOL/HDL Ratio: 3 Ratio
Triglycerides: 42 mg/dL (ref <150)

## 2011-08-15 LAB — CBC WITH DIFFERENTIAL/PLATELET
Basophils Relative: 1 % (ref 0–1)
Eosinophils Absolute: 0.4 10*3/uL (ref 0.0–0.7)
Eosinophils Relative: 9 % — ABNORMAL HIGH (ref 0–5)
Hemoglobin: 12.3 g/dL — ABNORMAL LOW (ref 13.0–17.0)
Lymphs Abs: 2.1 10*3/uL (ref 0.7–4.0)
MCH: 29.3 pg (ref 26.0–34.0)
MCHC: 33.2 g/dL (ref 30.0–36.0)
MCV: 88.3 fL (ref 78.0–100.0)
Monocytes Absolute: 0.4 10*3/uL (ref 0.1–1.0)
Monocytes Relative: 9 % (ref 3–12)
RBC: 4.2 MIL/uL — ABNORMAL LOW (ref 4.22–5.81)

## 2011-08-15 LAB — RPR

## 2011-08-15 LAB — URINALYSIS, MICROSCOPIC ONLY
Bacteria, UA: NONE SEEN
Crystals: NONE SEEN

## 2011-08-16 LAB — T-HELPER CELL (CD4) - (RCID CLINIC ONLY)
CD4 % Helper T Cell: 33 % (ref 33–55)
CD4 T Cell Abs: 670 uL (ref 400–2700)

## 2011-08-17 LAB — HIV-1 RNA QUANT-NO REFLEX-BLD: HIV-1 RNA Quant, Log: <1.3 {Log} (ref <1.30)

## 2011-08-18 ENCOUNTER — Other Ambulatory Visit: Payer: Self-pay | Admitting: *Deleted

## 2011-08-18 ENCOUNTER — Ambulatory Visit: Payer: Self-pay

## 2011-08-18 DIAGNOSIS — B20 Human immunodeficiency virus [HIV] disease: Secondary | ICD-10-CM

## 2011-08-18 MED ORDER — RALTEGRAVIR POTASSIUM 400 MG PO TABS: 400.0000 mg | ORAL_TABLET | Freq: Two times a day (BID) | ORAL | Status: DC

## 2011-08-18 MED ORDER — EMTRICITABINE-TENOFOVIR DF 200-300 MG PO TABS: 1.0000 | ORAL_TABLET | Freq: Every day | ORAL | Status: DC

## 2011-08-25 ENCOUNTER — Telehealth: Payer: Self-pay | Admitting: *Deleted

## 2011-08-25 NOTE — Telephone Encounter (Signed)
I called the pt as there is nothing recent to send to [employer]. States he has been homeless. Out of meds x 2 weeks. Has ann appt 08/31/11 with Dr. Ninetta Lights. Told him I will hold the forms (up front) until he is seen. Does not have insurance or pcp

## 2011-08-31 ENCOUNTER — Encounter: Payer: Self-pay | Admitting: Infectious Diseases

## 2011-08-31 ENCOUNTER — Ambulatory Visit (INDEPENDENT_AMBULATORY_CARE_PROVIDER_SITE_OTHER): Payer: Self-pay | Admitting: Infectious Diseases

## 2011-08-31 VITALS — BP 123/85 | HR 70 | Temp 98.0°F | Ht 67.0 in | Wt 127.0 lb

## 2011-08-31 DIAGNOSIS — Z113 Encounter for screening for infections with a predominantly sexual mode of transmission: Secondary | ICD-10-CM

## 2011-08-31 DIAGNOSIS — F341 Dysthymic disorder: Secondary | ICD-10-CM

## 2011-08-31 DIAGNOSIS — M549 Dorsalgia, unspecified: Secondary | ICD-10-CM

## 2011-08-31 DIAGNOSIS — Z23 Encounter for immunization: Secondary | ICD-10-CM

## 2011-08-31 DIAGNOSIS — B20 Human immunodeficiency virus [HIV] disease: Secondary | ICD-10-CM

## 2011-08-31 DIAGNOSIS — Z79899 Other long term (current) drug therapy: Secondary | ICD-10-CM

## 2011-08-31 DIAGNOSIS — H5440 Blindness, one eye, unspecified eye: Secondary | ICD-10-CM

## 2011-08-31 MED ORDER — EMTRICITABINE-TENOFOVIR DF 200-300 MG PO TABS: 1.0000 | ORAL_TABLET | Freq: Every day | ORAL | Status: DC

## 2011-08-31 MED ORDER — RALTEGRAVIR POTASSIUM 400 MG PO TABS: 400.0000 mg | ORAL_TABLET | Freq: Two times a day (BID) | ORAL | Status: DC
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
## 2011-08-31 NOTE — Assessment & Plan Note (Addendum)
Cautioned pt to synchronously take his ART (ie if he is out of 1, stop all). Will get him new rx's. Otherwise he is doing very well. He will come back in 4-5 months. He is given condoms. He gets PNVX update.

## 2011-08-31 NOTE — Assessment & Plan Note (Addendum)
Will refer him to Dennis Mathis. Continue his SSRI he has community/family support. He seems stable.

## 2011-08-31 NOTE — Assessment & Plan Note (Signed)
Will refill his pain meds as needed.

## 2011-08-31 NOTE — Progress Notes (Signed)
Addended by: Wendall Mola A on: 08/31/2011 10:37 AM   Modules accepted: Orders

## 2011-09-02 ENCOUNTER — Other Ambulatory Visit: Payer: Self-pay | Admitting: *Deleted

## 2011-09-02 DIAGNOSIS — F341 Dysthymic disorder: Secondary | ICD-10-CM

## 2011-09-02 DIAGNOSIS — F329 Major depressive disorder, single episode, unspecified: Secondary | ICD-10-CM

## 2011-09-02 DIAGNOSIS — B20 Human immunodeficiency virus [HIV] disease: Secondary | ICD-10-CM

## 2011-09-02 MED ORDER — SERTRALINE HCL 50 MG PO TABS: 50.0000 mg | ORAL_TABLET | Freq: Every day | ORAL | Status: DC

## 2011-09-02 MED ORDER — EMTRICITABINE-TENOFOVIR DF 200-300 MG PO TABS: 1.0000 | ORAL_TABLET | Freq: Every day | ORAL | Status: DC

## 2011-09-02 MED ORDER — RALTEGRAVIR POTASSIUM 400 MG PO TABS: 400.0000 mg | ORAL_TABLET | Freq: Two times a day (BID) | ORAL | Status: DC

## 2012-01-18 ENCOUNTER — Other Ambulatory Visit: Payer: Self-pay

## 2012-02-01 ENCOUNTER — Ambulatory Visit: Payer: Self-pay | Admitting: Infectious Diseases

## 2012-02-06 ENCOUNTER — Other Ambulatory Visit: Payer: Self-pay

## 2012-03-08 ENCOUNTER — Ambulatory Visit: Payer: Self-pay

## 2012-03-16 ENCOUNTER — Other Ambulatory Visit: Payer: Medicaid Other

## 2012-03-16 DIAGNOSIS — B20 Human immunodeficiency virus [HIV] disease: Secondary | ICD-10-CM

## 2012-03-16 LAB — T-HELPER CELL (CD4) - (RCID CLINIC ONLY)
CD4 % Helper T Cell: 28 % — ABNORMAL LOW (ref 33–55)
CD4 T Cell Abs: 580 uL (ref 400–2700)

## 2012-03-16 LAB — COMPLETE METABOLIC PANEL WITH GFR
ALT: 8 U/L (ref 0–53)
AST: 13 U/L (ref 0–37)
Albumin: 4.1 g/dL (ref 3.5–5.2)
Alkaline Phosphatase: 42 U/L (ref 39–117)
BUN: 9 mg/dL (ref 6–23)
CO2: 26 mEq/L (ref 19–32)
Calcium: 9.1 mg/dL (ref 8.4–10.5)
Chloride: 104 mEq/L (ref 96–112)
Creat: 0.83 mg/dL (ref 0.50–1.35)
GFR, Est African American: >89 mL/min
GFR, Est Non African American: >89 mL/min
Glucose, Bld: 87 mg/dL (ref 70–99)
Potassium: 3.5 mEq/L (ref 3.5–5.3)
Sodium: 137 mEq/L (ref 135–145)
Total Bilirubin: 0.8 mg/dL (ref 0.3–1.2)
Total Protein: 7 g/dL (ref 6.0–8.3)

## 2012-03-16 LAB — CBC
HCT: 35 % — ABNORMAL LOW (ref 39.0–52.0)
Hemoglobin: 12.2 g/dL — ABNORMAL LOW (ref 13.0–17.0)
MCH: 29.8 pg (ref 26.0–34.0)
MCHC: 34.9 g/dL (ref 30.0–36.0)
MCV: 85.6 fL (ref 78.0–100.0)
Platelets: 174 10*3/uL (ref 150–400)
RBC: 4.09 MIL/uL — ABNORMAL LOW (ref 4.22–5.81)
RDW: 14.1 % (ref 11.5–15.5)
WBC: 4.9 10*3/uL (ref 4.0–10.5)

## 2012-03-19 LAB — HIV-1 RNA QUANT-NO REFLEX-BLD: HIV-1 RNA Quant, Log: <1.3 {Log} (ref <1.30)

## 2012-03-29 ENCOUNTER — Ambulatory Visit: Payer: Medicaid Other | Admitting: Infectious Diseases

## 2012-04-02 ENCOUNTER — Telehealth: Payer: Self-pay | Admitting: *Deleted

## 2012-04-02 ENCOUNTER — Ambulatory Visit: Payer: Medicaid Other | Admitting: Infectious Diseases

## 2012-04-02 NOTE — Telephone Encounter (Signed)
Left message notifying patient that he missed an appointment today and needs to call his doctor's office to reschedule. Referred patient to [facility] for followup. Andree Coss

## 2012-08-15 ENCOUNTER — Encounter: Payer: Self-pay | Admitting: *Deleted

## 2012-10-22 ENCOUNTER — Other Ambulatory Visit: Payer: Self-pay | Admitting: Licensed Clinical Social Worker

## 2012-10-22 DIAGNOSIS — B20 Human immunodeficiency virus [HIV] disease: Secondary | ICD-10-CM

## 2012-10-22 MED ORDER — RALTEGRAVIR POTASSIUM 400 MG PO TABS: 400.0000 mg | ORAL_TABLET | Freq: Two times a day (BID) | ORAL | Status: DC

## 2012-10-22 MED ORDER — EMTRICITABINE-TENOFOVIR DF 200-300 MG PO TABS: 1.0000 | ORAL_TABLET | Freq: Every day | ORAL | Status: DC

## 2012-10-25 ENCOUNTER — Other Ambulatory Visit: Payer: Self-pay | Admitting: Licensed Clinical Social Worker

## 2012-10-25 DIAGNOSIS — B20 Human immunodeficiency virus [HIV] disease: Secondary | ICD-10-CM

## 2012-10-25 MED ORDER — RALTEGRAVIR POTASSIUM 400 MG PO TABS: 400.0000 mg | ORAL_TABLET | Freq: Two times a day (BID) | ORAL | Status: DC

## 2012-10-25 MED ORDER — EMTRICITABINE-TENOFOVIR DF 200-300 MG PO TABS: 1.0000 | ORAL_TABLET | Freq: Every day | ORAL | Status: DC

## 2012-12-12 ENCOUNTER — Other Ambulatory Visit: Payer: Self-pay | Admitting: Infectious Diseases

## 2012-12-12 ENCOUNTER — Telehealth: Payer: Self-pay | Admitting: *Deleted

## 2012-12-12 DIAGNOSIS — B20 Human immunodeficiency virus [HIV] disease: Secondary | ICD-10-CM

## 2012-12-12 DIAGNOSIS — Z113 Encounter for screening for infections with a predominantly sexual mode of transmission: Secondary | ICD-10-CM

## 2012-12-12 DIAGNOSIS — Z79899 Other long term (current) drug therapy: Secondary | ICD-10-CM

## 2012-12-12 NOTE — Telephone Encounter (Signed)
Scheduled pt appts.

## 2012-12-17 ENCOUNTER — Other Ambulatory Visit: Payer: Medicaid Other

## 2012-12-17 DIAGNOSIS — Z79899 Other long term (current) drug therapy: Secondary | ICD-10-CM

## 2012-12-17 DIAGNOSIS — B20 Human immunodeficiency virus [HIV] disease: Secondary | ICD-10-CM

## 2012-12-17 DIAGNOSIS — Z113 Encounter for screening for infections with a predominantly sexual mode of transmission: Secondary | ICD-10-CM

## 2012-12-17 LAB — CBC WITH DIFFERENTIAL/PLATELET
Basophils Relative: 1 % (ref 0–1)
Eosinophils Absolute: 0.3 10*3/uL (ref 0.0–0.7)
Eosinophils Relative: 6 % — ABNORMAL HIGH (ref 0–5)
MCH: 29 pg (ref 26.0–34.0)
MCHC: 34.3 g/dL (ref 30.0–36.0)
MCV: 84.4 fL (ref 78.0–100.0)
Monocytes Relative: 12 % (ref 3–12)
Neutrophils Relative %: 36 % — ABNORMAL LOW (ref 43–77)
Platelets: 173 10*3/uL (ref 150–400)

## 2012-12-17 LAB — LIPID PANEL
Cholesterol: 103 mg/dL (ref 0–200)
LDL Cholesterol: 52 mg/dL (ref 0–99)
Triglycerides: 56 mg/dL (ref <150)

## 2012-12-17 LAB — COMPREHENSIVE METABOLIC PANEL
BUN: 10 mg/dL (ref 6–23)
CO2: 30 mEq/L (ref 19–32)
Calcium: 9.4 mg/dL (ref 8.4–10.5)
Chloride: 103 mEq/L (ref 96–112)
Creat: 0.83 mg/dL (ref 0.50–1.35)
Glucose, Bld: 82 mg/dL (ref 70–99)
Total Bilirubin: 0.4 mg/dL (ref 0.3–1.2)

## 2012-12-17 NOTE — Addendum Note (Signed)
Addended by: Mariea Clonts D on: 12/17/2012 03:55 PM   Modules accepted: Orders

## 2012-12-18 LAB — T-HELPER CELL (CD4) - (RCID CLINIC ONLY): CD4 % Helper T Cell: 23 % — ABNORMAL LOW (ref 33–55)

## 2012-12-19 LAB — HIV-1 RNA QUANT-NO REFLEX-BLD
HIV 1 RNA Quant: 317 copies/mL — ABNORMAL HIGH (ref <20)
HIV-1 RNA Quant, Log: 2.5 {Log} — ABNORMAL HIGH (ref <1.30)

## 2012-12-31 ENCOUNTER — Encounter: Payer: Self-pay | Admitting: Infectious Diseases

## 2012-12-31 ENCOUNTER — Ambulatory Visit (INDEPENDENT_AMBULATORY_CARE_PROVIDER_SITE_OTHER): Payer: Medicaid Other | Admitting: Infectious Diseases

## 2012-12-31 VITALS — BP 113/80 | HR 82 | Temp 97.9°F | Ht 67.0 in | Wt 127.0 lb

## 2012-12-31 DIAGNOSIS — B20 Human immunodeficiency virus [HIV] disease: Secondary | ICD-10-CM

## 2012-12-31 DIAGNOSIS — M549 Dorsalgia, unspecified: Secondary | ICD-10-CM

## 2012-12-31 NOTE — Assessment & Plan Note (Signed)
Appears to be stable today, does not ask for refill of his pain rx today.

## 2012-12-31 NOTE — Progress Notes (Signed)
  Subjective:    Patient ID: Dennis Mathis, male    DOB: [birth year], 47 y.o.   MRN: [id number]  HPI 47 yo M dx HIV+ 11-16-05. Had routine test. Previously homeless. Blind in L eye.  Prev taking TRV/ISN. Had gap in medicine/ADAP, missed ~ 1 week.  Back pain is intermittent. Still f/u with PCP.  HIV 1 RNA Quant (copies/mL)  Date Value  12/17/2012 317*  03/16/2012 <20   08/15/2011 <20      CD4 T Cell Abs (cmm)  Date Value  12/17/2012 590   03/16/2012 580   08/15/2011 670     Wearing seat belt. Doing some walking, to store.   Review of Systems  Constitutional: Negative for appetite change and unexpected weight change.  Gastrointestinal: Negative for diarrhea and constipation.  Genitourinary: Negative for difficulty urinating.  Musculoskeletal: Positive for back pain.       Objective:   Physical Exam  Constitutional: He appears well-developed and well-nourished.  HENT:  Mouth/Throat: Dental caries present. No oropharyngeal exudate.  Eyes: EOM are normal.  Neck: Neck supple.  Cardiovascular: Normal rate, regular rhythm and normal heart sounds.   Pulmonary/Chest: Effort normal and breath sounds normal.  Abdominal: Soft. Bowel sounds are normal. He exhibits no distension. There is no tenderness.  Lymphadenopathy:    He has no cervical adenopathy.          Assessment & Plan:

## 2012-12-31 NOTE — Assessment & Plan Note (Signed)
He is doing well with exception of missed meds. He is aware of need to be more adherent. Will check HIV RNA and genotype today. Will him back in 6 months if his labs are ok. Will have him seen by dental. Given condoms.

## 2013-01-07 ENCOUNTER — Other Ambulatory Visit: Payer: Medicaid Other

## 2013-03-21 ENCOUNTER — Other Ambulatory Visit: Payer: Self-pay

## 2013-06-19 ENCOUNTER — Other Ambulatory Visit: Payer: Medicaid Other

## 2013-06-19 ENCOUNTER — Other Ambulatory Visit: Payer: Self-pay | Admitting: *Deleted

## 2013-06-19 DIAGNOSIS — B20 Human immunodeficiency virus [HIV] disease: Secondary | ICD-10-CM

## 2013-07-01 ENCOUNTER — Ambulatory Visit: Payer: Medicaid Other | Admitting: Infectious Diseases

## 2013-08-27 ENCOUNTER — Other Ambulatory Visit: Payer: Medicaid Other

## 2013-08-27 DIAGNOSIS — B20 Human immunodeficiency virus [HIV] disease: Secondary | ICD-10-CM

## 2013-08-27 LAB — CBC WITH DIFFERENTIAL/PLATELET
BASOS PCT: 0 % (ref 0–1)
Basophils Absolute: 0 10*3/uL (ref 0.0–0.1)
EOS PCT: 5 % (ref 0–5)
Eosinophils Absolute: 0.3 10*3/uL (ref 0.0–0.7)
HEMATOCRIT: 34.3 % — AB (ref 39.0–52.0)
Hemoglobin: 11.7 g/dL — ABNORMAL LOW (ref 13.0–17.0)
Lymphocytes Relative: 47 % — ABNORMAL HIGH (ref 12–46)
Lymphs Abs: 2.8 10*3/uL (ref 0.7–4.0)
MCH: 29 pg (ref 26.0–34.0)
MCHC: 34.1 g/dL (ref 30.0–36.0)
MCV: 84.9 fL (ref 78.0–100.0)
MONO ABS: 0.5 10*3/uL (ref 0.1–1.0)
Monocytes Relative: 8 % (ref 3–12)
NEUTROS ABS: 2.4 10*3/uL (ref 1.7–7.7)
Neutrophils Relative %: 40 % — ABNORMAL LOW (ref 43–77)
Platelets: 192 10*3/uL (ref 150–400)
RBC: 4.04 MIL/uL — ABNORMAL LOW (ref 4.22–5.81)
RDW: 14.9 % (ref 11.5–15.5)
WBC: 6 10*3/uL (ref 4.0–10.5)

## 2013-08-28 LAB — COMPREHENSIVE METABOLIC PANEL
ALK PHOS: 54 U/L (ref 39–117)
ALT: 8 U/L (ref 0–53)
AST: 12 U/L (ref 0–37)
Albumin: 3.9 g/dL (ref 3.5–5.2)
BUN: 10 mg/dL (ref 6–23)
CO2: 29 mEq/L (ref 19–32)
CREATININE: 0.9 mg/dL (ref 0.50–1.35)
Calcium: 9 mg/dL (ref 8.4–10.5)
Chloride: 106 mEq/L (ref 96–112)
Glucose, Bld: 82 mg/dL (ref 70–99)
POTASSIUM: 3.8 meq/L (ref 3.5–5.3)
Sodium: 140 mEq/L (ref 135–145)
Total Bilirubin: 0.5 mg/dL (ref 0.2–1.2)
Total Protein: 7.5 g/dL (ref 6.0–8.3)

## 2013-08-28 LAB — T-HELPER CELL (CD4) - (RCID CLINIC ONLY)
CD4 % Helper T Cell: 21 % — ABNORMAL LOW (ref 33–55)
CD4 T Cell Abs: 610 /uL (ref 400–2700)

## 2013-08-29 LAB — HIV-1 RNA ULTRAQUANT REFLEX TO GENTYP+
HIV 1 RNA Quant: 320 copies/mL — ABNORMAL HIGH (ref <20)
HIV-1 RNA Quant, Log: 2.51 {Log} — ABNORMAL HIGH (ref <1.30)

## 2013-09-03 LAB — HIV-1 INTEGRASE GENOTYPE

## 2013-09-17 ENCOUNTER — Telehealth: Payer: Self-pay | Admitting: *Deleted

## 2013-09-17 NOTE — Telephone Encounter (Signed)
Message copied by Macy MisOCKERHAM, Darrio Bade A on Tue Sep 17, 2013 12:38 PM ------      Message from: HATCHER, JEFFREY C      Created: Thu Aug 29, 2013  1:00 PM       Pt needs appt ------

## 2013-09-17 NOTE — Telephone Encounter (Signed)
Patient notified of appt with Dr. Ninetta LightsHatcher for 10/21/13 at 3:30 pm. Dennis Mathis

## 2013-10-21 ENCOUNTER — Ambulatory Visit (INDEPENDENT_AMBULATORY_CARE_PROVIDER_SITE_OTHER): Payer: Medicaid Other | Admitting: Infectious Diseases

## 2013-10-21 ENCOUNTER — Encounter: Payer: Self-pay | Admitting: Infectious Diseases

## 2013-10-21 VITALS — BP 115/75 | HR 76 | Temp 98.0°F | Ht 67.0 in | Wt 124.0 lb

## 2013-10-21 DIAGNOSIS — B20 Human immunodeficiency virus [HIV] disease: Secondary | ICD-10-CM

## 2013-10-21 LAB — HEMOGLOBIN A1C
HEMOGLOBIN A1C: 5.6 % (ref <5.7)
Mean Plasma Glucose: 114 mg/dL (ref <117)

## 2013-10-21 LAB — TSH: TSH: 1.23 u[IU]/mL (ref 0.350–4.500)

## 2013-10-21 NOTE — Progress Notes (Signed)
   Subjective:    Patient ID: Dennis Mathis, male    DOB: 1966/04/05, 48 y.o.   MRN: [id number]  HPI 48 yo M dx HIV+ 11-16-05. Had routine test. Previously homeless. Blind in L eye.  Prev taking TRV/ISN. Has been missing doses. 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  As well having dental pain. Having headaches, backaches.   HIV 1 RNA Quant (copies/mL)  Date Value  08/27/2013 320*  12/17/2012 317*  03/16/2012 <20      CD4 T Cell Abs (/uL)  Date Value  08/27/2013 610   12/17/2012 590   03/16/2012 580     Review of Systems  Constitutional: Negative for appetite change and unexpected weight change.  Gastrointestinal: Negative for diarrhea and constipation.  Genitourinary: Negative for difficulty urinating.  Neurological: Positive for headaches.   Is having headaches in his temple. No change in vision. Took ibuprofen, tylenol, Goodies without relief. Headache is sharp. No photophobia.       Objective:   Physical Exam  Constitutional: He appears well-developed and well-nourished.  HENT:  Mouth/Throat: No oropharyngeal exudate.  Eyes: EOM are normal.  Neck: Neck supple.  Cardiovascular: Normal rate, regular rhythm and normal heart sounds.   Pulmonary/Chest: Effort normal and breath sounds normal.  Abdominal: Soft. Bowel sounds are normal. There is no tenderness. There is no rebound.  Lymphadenopathy:    He has no cervical adenopathy.          Assessment & Plan:

## 2013-10-21 NOTE — Assessment & Plan Note (Signed)
Will do ultra-deep sequencing to see if he has RT or PI mutations. 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 His sister also asks for TSH, PSA and A1C.  Hep B S Ab + 2008.  Will see him back in 3 weeks.  He is given condoms.

## 2013-10-22 LAB — PSA: PSA: 1.41 ng/mL (ref <=4.00)

## 2013-11-27 ENCOUNTER — Encounter: Payer: Self-pay | Admitting: Infectious Diseases

## 2013-12-02 ENCOUNTER — Encounter: Payer: Self-pay | Admitting: Infectious Diseases

## 2013-12-02 ENCOUNTER — Ambulatory Visit (INDEPENDENT_AMBULATORY_CARE_PROVIDER_SITE_OTHER): Payer: Medicaid Other | Admitting: Infectious Diseases

## 2013-12-02 VITALS — BP 123/85 | HR 82 | Temp 97.9°F | Ht 67.0 in | Wt 126.0 lb

## 2013-12-02 DIAGNOSIS — B20 Human immunodeficiency virus [HIV] disease: Secondary | ICD-10-CM

## 2013-12-02 MED ORDER — DARUNAVIR-COBICISTAT 800-150 MG PO TABS: 1.0000 | ORAL_TABLET | Freq: Every day | ORAL | Status: DC

## 2013-12-02 MED ORDER — EMTRICITAB-RILPIVIR-TENOFOV DF 200-25-300 MG PO TABS: 1.0000 | ORAL_TABLET | Freq: Every day | ORAL | Status: DC

## 2013-12-02 NOTE — Assessment & Plan Note (Signed)
He has mutli-class resistance. Spoke with pharmacy, will change him to precoxib and complera. See him back in 6 weeks to see how he is tolerating.

## 2013-12-02 NOTE — Progress Notes (Signed)
   Subjective:    Patient ID: Dennis Mathis, male    DOB: September 13, 1965, 48 y.o.   MRN: [id number]  HPI 48 yo M dx HIV+ 11-16-05. Had routine test. Previously homeless. Blind in L eye.  Prev taking TRV/ISN. Has been missing doses. 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 Hep B S Ab+.  At his f/u appt 11-05-13 he had deep sequencing showing multiple drug mutations.  HIV 1 RNA Quant (copies/mL)  Date Value  08/27/2013 320*  12/17/2012 317*  03/16/2012 <20      CD4 T Cell Abs (/uL)  Date Value  08/27/2013 610   12/17/2012 590   03/16/2012 580    Still having some occas back pain. 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   Review of Systems  Constitutional: Negative for appetite change and unexpected weight change.  Gastrointestinal: Negative for diarrhea and constipation.  Genitourinary: Negative for difficulty urinating.       Objective:   Physical Exam  Constitutional: He appears well-developed and well-nourished.  HENT:  Mouth/Throat: No oropharyngeal exudate.  Neck: Neck supple.  Cardiovascular: Normal rate, regular rhythm and normal heart sounds.   Pulmonary/Chest: Effort normal and breath sounds normal.  Abdominal: Soft. Bowel sounds are normal. He exhibits no distension. There is no tenderness.  Lymphadenopathy:    He has no cervical adenopathy.          Assessment & Plan:

## 2013-12-16 ENCOUNTER — Encounter: Payer: Self-pay | Admitting: Infectious Diseases

## 2014-04-21 ENCOUNTER — Telehealth: Payer: Self-pay | Admitting: *Deleted

## 2014-04-21 NOTE — Telephone Encounter (Signed)
Left message with patient's emergency contact, Steward DroneBrenda to have patient call the office with updated phone info and to schedule a follow up appointment with Dr. Ninetta LightsHatcher. Dennis Mathis

## 2014-04-22 ENCOUNTER — Other Ambulatory Visit: Payer: Self-pay | Admitting: Infectious Diseases

## 2014-04-22 DIAGNOSIS — B20 Human immunodeficiency virus [HIV] disease: Secondary | ICD-10-CM

## 2014-04-23 ENCOUNTER — Other Ambulatory Visit (HOSPITAL_COMMUNITY)
Admission: RE | Admit: 2014-04-23 | Discharge: 2014-04-23 | Disposition: A | Discharge status: Home / Self Care | Payer: Medicaid Other | Source: Ambulatory Visit | Attending: Infectious Disease | Admitting: Infectious Disease

## 2014-04-23 ENCOUNTER — Other Ambulatory Visit: Payer: Medicaid Other

## 2014-04-23 DIAGNOSIS — B20 Human immunodeficiency virus [HIV] disease: Secondary | ICD-10-CM

## 2014-04-23 DIAGNOSIS — Z113 Encounter for screening for infections with a predominantly sexual mode of transmission: Secondary | ICD-10-CM | POA: Diagnosis not present

## 2014-04-23 LAB — COMPREHENSIVE METABOLIC PANEL
ALBUMIN: 3.9 g/dL (ref 3.5–5.2)
ALK PHOS: 49 U/L (ref 39–117)
ALT: 8 U/L (ref 0–53)
AST: 14 U/L (ref 0–37)
BUN: 8 mg/dL (ref 6–23)
CO2: 29 mEq/L (ref 19–32)
CREATININE: 0.97 mg/dL (ref 0.50–1.35)
Calcium: 9.1 mg/dL (ref 8.4–10.5)
Chloride: 103 mEq/L (ref 96–112)
Glucose, Bld: 90 mg/dL (ref 70–99)
POTASSIUM: 3.7 meq/L (ref 3.5–5.3)
Sodium: 140 mEq/L (ref 135–145)
Total Bilirubin: 0.8 mg/dL (ref 0.2–1.2)
Total Protein: 7.3 g/dL (ref 6.0–8.3)

## 2014-04-23 LAB — CBC WITH DIFFERENTIAL/PLATELET
Basophils Absolute: 0 10*3/uL (ref 0.0–0.1)
Basophils Relative: 1 % (ref 0–1)
EOS ABS: 0.4 10*3/uL (ref 0.0–0.7)
EOS PCT: 8 % — AB (ref 0–5)
HEMATOCRIT: 37 % — AB (ref 39.0–52.0)
Hemoglobin: 12.6 g/dL — ABNORMAL LOW (ref 13.0–17.0)
LYMPHS ABS: 1.9 10*3/uL (ref 0.7–4.0)
Lymphocytes Relative: 42 % (ref 12–46)
MCH: 30.6 pg (ref 26.0–34.0)
MCHC: 34.1 g/dL (ref 30.0–36.0)
MCV: 89.8 fL (ref 78.0–100.0)
MONO ABS: 0.5 10*3/uL (ref 0.1–1.0)
MONOS PCT: 11 % (ref 3–12)
MPV: 9.7 fL (ref 9.4–12.4)
Neutro Abs: 1.7 10*3/uL (ref 1.7–7.7)
Neutrophils Relative %: 38 % — ABNORMAL LOW (ref 43–77)
Platelets: 199 10*3/uL (ref 150–400)
RBC: 4.12 MIL/uL — ABNORMAL LOW (ref 4.22–5.81)
RDW: 15.4 % (ref 11.5–15.5)
WBC: 4.5 10*3/uL (ref 4.0–10.5)

## 2014-04-23 LAB — RPR

## 2014-04-24 LAB — HIV-1 RNA QUANT-NO REFLEX-BLD

## 2014-04-24 LAB — URINE CYTOLOGY ANCILLARY ONLY
Chlamydia: NEGATIVE
Neisseria Gonorrhea: NEGATIVE

## 2014-04-24 LAB — T-HELPER CELL (CD4) - (RCID CLINIC ONLY)
CD4 T CELL HELPER: 28 % — AB (ref 33–55)
CD4 T Cell Abs: 460 /uL (ref 400–2700)

## 2014-05-19 ENCOUNTER — Ambulatory Visit (INDEPENDENT_AMBULATORY_CARE_PROVIDER_SITE_OTHER): Payer: Medicaid Other | Admitting: Infectious Diseases

## 2014-05-19 ENCOUNTER — Encounter: Payer: Self-pay | Admitting: Infectious Diseases

## 2014-05-19 VITALS — BP 112/76 | HR 71 | Temp 98.1°F | Ht 67.0 in | Wt 125.0 lb

## 2014-05-19 DIAGNOSIS — Z23 Encounter for immunization: Secondary | ICD-10-CM

## 2014-05-19 DIAGNOSIS — Z113 Encounter for screening for infections with a predominantly sexual mode of transmission: Secondary | ICD-10-CM

## 2014-05-19 DIAGNOSIS — Z79899 Other long term (current) drug therapy: Secondary | ICD-10-CM

## 2014-05-19 DIAGNOSIS — B20 Human immunodeficiency virus [HIV] disease: Secondary | ICD-10-CM

## 2014-05-19 NOTE — Progress Notes (Signed)
   Subjective:    Patient ID: Dennis Mathis, male    DOB: [birth year], 49 y.o.   MRN: [id number]  HPI 49 yo M dx HIV+ 11-16-05. Had routine test. Previously homeless. Blind in L eye.  Prev taking TRV/ISN. Has been missing doses. 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 Hep B S Ab+.  At his f/u appt 11-05-13 he had deep sequencing showing multiple drug mutations.   At his f/u in July he was changed to DRVc/Complera.  Has been taking his meds without missing. Has been feeling well. Living in own appt. Staying warm, has food.   HIV 1 RNA QUANT (copies/mL)  Date Value  04/23/2014 <20  08/27/2013 320*  12/17/2012 317*   CD4 T CELL ABS  Date Value  04/23/2014 460 /uL  08/27/2013 610 /uL  12/17/2012 590 cmm    Review of Systems  Constitutional: Negative for appetite change and unexpected weight change.  Gastrointestinal: Negative for diarrhea and constipation.  Genitourinary: Negative for difficulty urinating.       Objective:   Physical Exam  Constitutional: He appears well-developed and well-nourished.  HENT:  Mouth/Throat: No oropharyngeal exudate.  Neck: Neck supple.  Cardiovascular: Normal rate, regular rhythm and normal heart sounds.   Pulmonary/Chest: Effort normal and breath sounds normal.  Abdominal: Soft. Bowel sounds are normal. There is no tenderness. There is no rebound.  Lymphadenopathy:    He has no cervical adenopathy.          Assessment & Plan:

## 2014-05-19 NOTE — Assessment & Plan Note (Signed)
He is doing very well on his salvage rx. He is given condoms. Flu shot.  He appears to have stable living situation at this point.  Will see him back in 6 months.

## 2014-12-23 ENCOUNTER — Other Ambulatory Visit: Payer: Self-pay | Admitting: Infectious Diseases

## 2015-04-23 ENCOUNTER — Other Ambulatory Visit: Payer: Self-pay | Admitting: Infectious Diseases

## 2015-04-27 ENCOUNTER — Other Ambulatory Visit: Payer: Self-pay | Admitting: *Deleted

## 2015-04-27 ENCOUNTER — Other Ambulatory Visit: Payer: Medicaid Other

## 2015-04-27 DIAGNOSIS — Z113 Encounter for screening for infections with a predominantly sexual mode of transmission: Secondary | ICD-10-CM

## 2015-04-27 DIAGNOSIS — Z79899 Other long term (current) drug therapy: Secondary | ICD-10-CM

## 2015-04-27 DIAGNOSIS — B20 Human immunodeficiency virus [HIV] disease: Secondary | ICD-10-CM

## 2015-04-27 LAB — COMPREHENSIVE METABOLIC PANEL
ALBUMIN: 3.8 g/dL (ref 3.6–5.1)
ALK PHOS: 45 U/L (ref 40–115)
ALT: 8 U/L — AB (ref 9–46)
AST: 12 U/L (ref 10–40)
BILIRUBIN TOTAL: 0.3 mg/dL (ref 0.2–1.2)
BUN: 9 mg/dL (ref 7–25)
CALCIUM: 9.3 mg/dL (ref 8.6–10.3)
CO2: 25 mmol/L (ref 20–31)
CREATININE: 0.81 mg/dL (ref 0.60–1.35)
Chloride: 104 mmol/L (ref 98–110)
Glucose, Bld: 100 mg/dL — ABNORMAL HIGH (ref 65–99)
Potassium: 4 mmol/L (ref 3.5–5.3)
Sodium: 138 mmol/L (ref 135–146)
TOTAL PROTEIN: 6.7 g/dL (ref 6.1–8.1)

## 2015-04-27 LAB — LIPID PANEL
CHOLESTEROL: 131 mg/dL (ref 125–200)
HDL: 55 mg/dL (ref >=40)
LDL Cholesterol: 70 mg/dL (ref <130)
TRIGLYCERIDES: 32 mg/dL (ref <150)
Total CHOL/HDL Ratio: 2.4 Ratio (ref <=5.0)
VLDL: 6 mg/dL (ref <30)

## 2015-04-27 LAB — CBC
HEMATOCRIT: 36.3 % — AB (ref 39.0–52.0)
Hemoglobin: 12.4 g/dL — ABNORMAL LOW (ref 13.0–17.0)
MCH: 30.4 pg (ref 26.0–34.0)
MCHC: 34.2 g/dL (ref 30.0–36.0)
MCV: 89 fL (ref 78.0–100.0)
MPV: 9.9 fL (ref 8.6–12.4)
Platelets: 183 10*3/uL (ref 150–400)
RBC: 4.08 MIL/uL — ABNORMAL LOW (ref 4.22–5.81)
RDW: 16 % — AB (ref 11.5–15.5)
WBC: 6.7 10*3/uL (ref 4.0–10.5)

## 2015-04-27 MED ORDER — EMTRICITAB-RILPIVIR-TENOFOV DF 200-25-300 MG PO TABS: 1.0000 | ORAL_TABLET | Freq: Every day | ORAL | Status: DC

## 2015-04-27 MED ORDER — DARUNAVIR-COBICISTAT 800-150 MG PO TABS: ORAL_TABLET | ORAL | Status: DC

## 2015-04-28 LAB — T-HELPER CELL (CD4) - (RCID CLINIC ONLY)
CD4 % Helper T Cell: 29 % — ABNORMAL LOW (ref 33–55)
CD4 T Cell Abs: 770 /uL (ref 400–2700)

## 2015-04-28 LAB — HIV-1 RNA QUANT-NO REFLEX-BLD
HIV 1 RNA Quant: <20 copies/mL (ref <20)
HIV-1 RNA Quant, Log: <1.3 Log copies/mL (ref <1.30)

## 2015-04-28 LAB — RPR

## 2015-05-25 ENCOUNTER — Ambulatory Visit: Payer: Medicaid Other | Admitting: Infectious Diseases

## 2015-05-30 ENCOUNTER — Other Ambulatory Visit: Payer: Self-pay | Admitting: Infectious Diseases

## 2015-05-30 DIAGNOSIS — B20 Human immunodeficiency virus [HIV] disease: Secondary | ICD-10-CM

## 2015-06-02 ENCOUNTER — Telehealth: Payer: Self-pay | Admitting: *Deleted

## 2015-06-02 NOTE — Telephone Encounter (Signed)
Refills cancelled pending patient's appointment tomorrow. He has not been seen in a year and the Rx was put on hold until after his appointment. Dennis Mathis

## 2015-06-03 ENCOUNTER — Ambulatory Visit (INDEPENDENT_AMBULATORY_CARE_PROVIDER_SITE_OTHER): Payer: Medicaid Other | Admitting: Infectious Diseases

## 2015-06-03 ENCOUNTER — Encounter: Payer: Self-pay | Admitting: Infectious Diseases

## 2015-06-03 VITALS — BP 129/89 | HR 79 | Temp 98.2°F | Wt 127.0 lb

## 2015-06-03 DIAGNOSIS — Z79899 Other long term (current) drug therapy: Secondary | ICD-10-CM

## 2015-06-03 DIAGNOSIS — M549 Dorsalgia, unspecified: Secondary | ICD-10-CM | POA: Diagnosis not present

## 2015-06-03 DIAGNOSIS — B20 Human immunodeficiency virus [HIV] disease: Secondary | ICD-10-CM

## 2015-06-03 DIAGNOSIS — Z113 Encounter for screening for infections with a predominantly sexual mode of transmission: Secondary | ICD-10-CM | POA: Diagnosis not present
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
## 2015-06-03 NOTE — Assessment & Plan Note (Addendum)
He is doing very well.  He is given condoms,  offered flu shot (wants to come back) His partner is HIV-, gets tested regularly. Asked if she wants PREP Will see him back in 6 months.
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
## 2015-06-04 ENCOUNTER — Other Ambulatory Visit: Payer: Self-pay | Admitting: *Deleted

## 2015-06-04 DIAGNOSIS — B20 Human immunodeficiency virus [HIV] disease: Secondary | ICD-10-CM

## 2015-06-04 MED ORDER — EMTRICITAB-RILPIVIR-TENOFOV DF 200-25-300 MG PO TABS: ORAL_TABLET | ORAL | Status: DC

## 2015-06-04 MED ORDER — DARUNAVIR-COBICISTAT 800-150 MG PO TABS: ORAL_TABLET | ORAL | Status: DC

## 2015-12-21 ENCOUNTER — Other Ambulatory Visit (HOSPITAL_COMMUNITY)
Admission: RE | Admit: 2015-12-21 | Discharge: 2015-12-21 | Disposition: A | Discharge status: Home / Self Care | Payer: Medicaid Other | Source: Ambulatory Visit | Attending: Infectious Diseases | Admitting: Infectious Diseases

## 2015-12-21 ENCOUNTER — Other Ambulatory Visit: Payer: Medicaid Other

## 2015-12-21 DIAGNOSIS — Z79899 Other long term (current) drug therapy: Secondary | ICD-10-CM

## 2015-12-21 DIAGNOSIS — Z113 Encounter for screening for infections with a predominantly sexual mode of transmission: Secondary | ICD-10-CM

## 2015-12-21 DIAGNOSIS — B20 Human immunodeficiency virus [HIV] disease: Secondary | ICD-10-CM

## 2015-12-22 LAB — COMPREHENSIVE METABOLIC PANEL
ALT: 6 U/L — ABNORMAL LOW (ref 9–46)
AST: 11 U/L (ref 10–40)
Albumin: 3.9 g/dL (ref 3.6–5.1)
Alkaline Phosphatase: 47 U/L (ref 40–115)
BILIRUBIN TOTAL: 0.4 mg/dL (ref 0.2–1.2)
BUN: 10 mg/dL (ref 7–25)
CALCIUM: 8.8 mg/dL (ref 8.6–10.3)
CO2: 28 mmol/L (ref 20–31)
Chloride: 106 mmol/L (ref 98–110)
Creat: 0.95 mg/dL (ref 0.60–1.35)
GLUCOSE: 83 mg/dL (ref 65–99)
Potassium: 3.6 mmol/L (ref 3.5–5.3)
SODIUM: 141 mmol/L (ref 135–146)
Total Protein: 6.5 g/dL (ref 6.1–8.1)

## 2015-12-22 LAB — URINE CYTOLOGY ANCILLARY ONLY
Chlamydia: NEGATIVE
Neisseria Gonorrhea: NEGATIVE

## 2015-12-22 LAB — T-HELPER CELL (CD4) - (RCID CLINIC ONLY)
CD4 % Helper T Cell: 29 % — ABNORMAL LOW (ref 33–55)
CD4 T Cell Abs: 630 /uL (ref 400–2700)

## 2015-12-22 LAB — LIPID PANEL
Cholesterol: 126 mg/dL (ref 125–200)
HDL: 50 mg/dL (ref >=40)
LDL CALC: 64 mg/dL (ref <130)
Total CHOL/HDL Ratio: 2.5 Ratio (ref <=5.0)
Triglycerides: 62 mg/dL (ref <150)
VLDL: 12 mg/dL (ref <30)

## 2015-12-22 LAB — CBC
HEMATOCRIT: 36.4 % — AB (ref 38.5–50.0)
HEMOGLOBIN: 12.1 g/dL — AB (ref 13.2–17.1)
MCH: 29.8 pg (ref 27.0–33.0)
MCHC: 33.2 g/dL (ref 32.0–36.0)
MCV: 89.7 fL (ref 80.0–100.0)
MPV: 10.2 fL (ref 7.5–12.5)
PLATELETS: 179 10*3/uL (ref 140–400)
RBC: 4.06 MIL/uL — AB (ref 4.20–5.80)
RDW: 15.7 % — ABNORMAL HIGH (ref 11.0–15.0)
WBC: 5.4 10*3/uL (ref 3.8–10.8)

## 2015-12-22 LAB — RPR

## 2015-12-22 LAB — HIV-1 RNA QUANT-NO REFLEX-BLD: HIV-1 RNA Quant, Log: <1.3 Log copies/mL (ref <1.30)

## 2016-01-04 ENCOUNTER — Ambulatory Visit (INDEPENDENT_AMBULATORY_CARE_PROVIDER_SITE_OTHER): Payer: Medicaid Other | Admitting: Infectious Diseases

## 2016-01-04 ENCOUNTER — Encounter: Payer: Self-pay | Admitting: Infectious Diseases

## 2016-01-04 VITALS — BP 116/73 | HR 72 | Temp 97.6°F | Wt 124.0 lb

## 2016-01-04 DIAGNOSIS — B20 Human immunodeficiency virus [HIV] disease: Secondary | ICD-10-CM | POA: Diagnosis not present

## 2016-01-04 DIAGNOSIS — Z113 Encounter for screening for infections with a predominantly sexual mode of transmission: Secondary | ICD-10-CM | POA: Diagnosis not present

## 2016-01-04 DIAGNOSIS — Z79899 Other long term (current) drug therapy: Secondary | ICD-10-CM | POA: Diagnosis not present

## 2016-01-04 NOTE — Assessment & Plan Note (Addendum)
He is doing very well on his current rx.  Offered/refused condoms.  Given instructions for [place].  Will get flu shot "next time" rtc in 6 months.

## 2016-01-04 NOTE — Progress Notes (Signed)
   Subjective:    Patient ID: Dennis Mathis, male    DOB: January 09, 1966, 50 y.o.   MRN: [id number]  HPI 50 yo M dx HIV+ 11-16-05. Had routine test. Previously homeless. Blind in L eye.  Prev taking TRV/ISN. Has been missing doses. 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 Hep B S Ab+.  At his f/u appt 11-05-13 he had deep sequencing showing multiple drug mutations. Was changed to DRVc/complera.   HIV 1 RNA Quant (copies/mL)  Date Value  12/21/2015 <20  04/27/2015 <20  04/23/2014 <20   CD4 T Cell Abs (/uL)  Date Value  12/21/2015 630  04/27/2015 770  04/23/2014 460   Has had some mild GI upset with his ART if he takes without food. Has been trying hard to be adherent.  HOusing stable. Has had decreased appetite but wt going up.   Review of Systems  Constitutional: Negative for unexpected weight change.  Respiratory: Negative for cough and shortness of breath.   Gastrointestinal: Negative for constipation and diarrhea.  Genitourinary: Negative for difficulty urinating.  Neurological: Negative for headaches.       Objective:   Physical Exam  Constitutional: He appears well-developed and well-nourished.  Eyes:    Neck: Neck supple.  Cardiovascular: Normal rate, regular rhythm and normal heart sounds.   Pulmonary/Chest: Effort normal and breath sounds normal.  Abdominal: Soft. Bowel sounds are normal. There is no tenderness. There is no rebound.  Musculoskeletal: He exhibits no edema.  Lymphadenopathy:    He has no cervical adenopathy.      Assessment & Plan:

## 2016-03-10 ENCOUNTER — Other Ambulatory Visit: Payer: Self-pay | Admitting: Infectious Diseases

## 2016-03-10 DIAGNOSIS — B20 Human immunodeficiency virus [HIV] disease: Secondary | ICD-10-CM

## 2016-12-17 ENCOUNTER — Other Ambulatory Visit: Payer: Self-pay | Admitting: Infectious Diseases

## 2016-12-17 DIAGNOSIS — B20 Human immunodeficiency virus [HIV] disease: Secondary | ICD-10-CM

## 2016-12-18 ENCOUNTER — Encounter (HOSPITAL_COMMUNITY): Payer: Self-pay | Admitting: Nurse Practitioner

## 2016-12-18 ENCOUNTER — Emergency Department (HOSPITAL_COMMUNITY)
Admission: EM | Admit: 2016-12-18 | Discharge: 2016-12-18 | Disposition: A | Discharge status: Home / Self Care | Payer: Medicaid Other | Attending: Emergency Medicine | Admitting: Emergency Medicine

## 2016-12-18 ENCOUNTER — Emergency Department (HOSPITAL_COMMUNITY): Payer: Medicaid Other

## 2016-12-18 DIAGNOSIS — L03116 Cellulitis of left lower limb: Secondary | ICD-10-CM | POA: Diagnosis not present

## 2016-12-18 DIAGNOSIS — F17210 Nicotine dependence, cigarettes, uncomplicated: Secondary | ICD-10-CM | POA: Diagnosis not present

## 2016-12-18 DIAGNOSIS — B20 Human immunodeficiency virus [HIV] disease: Secondary | ICD-10-CM | POA: Insufficient documentation

## 2016-12-18 DIAGNOSIS — M79672 Pain in left foot: Secondary | ICD-10-CM | POA: Diagnosis present

## 2016-12-18 DIAGNOSIS — Z79899 Other long term (current) drug therapy: Secondary | ICD-10-CM | POA: Insufficient documentation

## 2016-12-18 HISTORY — DX: Human immunodeficiency virus (HIV) disease: B20

## 2016-12-18 MED ORDER — SULFAMETHOXAZOLE-TRIMETHOPRIM 800-160 MG PO TABS
1.0000 | ORAL_TABLET | Freq: Once | ORAL | Status: AC
  Administered 2016-12-18: 1 via ORAL
  Filled 2016-12-18: qty 1

## 2016-12-18 MED ORDER — SULFAMETHOXAZOLE-TRIMETHOPRIM 800-160 MG PO TABS: 1.0000 | ORAL_TABLET | Freq: Two times a day (BID) | ORAL | 0 refills | Status: AC

## 2016-12-18 MED ORDER — OXYCODONE-ACETAMINOPHEN 5-325 MG PO TABS: 1.0000 | ORAL_TABLET | Freq: Once | ORAL | Status: DC

## 2016-12-18 MED ORDER — IBUPROFEN 800 MG PO TABS
800.0000 mg | ORAL_TABLET | Freq: Once | ORAL | Status: AC
  Administered 2016-12-18: 800 mg via ORAL
  Filled 2016-12-18: qty 1

## 2016-12-18 NOTE — ED Triage Notes (Signed)
Pt presents with c/o left foot pain. The pain began about a week ago. He denies any injuries to the foot. The pain is mostly in his fourth and fifth toes and he has noticed a hard mass between the two toes. He's been soaking the foot in epsom salt with no improvement.

## 2016-12-18 NOTE — Discharge Instructions (Signed)
Your first dose of antibiotics has been given in the emergency department. Please take Bactrim every 12 hours for the next 7 days. Please call and schedule a follow-up appointment with the infectious disease clinic, preferably in the next 2-3 days to have your left foot rechecked.  If you develop new or worsening symptoms despite having taken antibiotics for 48 hours, including fever, chills, red streaking up the leg, or severely worsening pain, please return to the emergency department for a wound recheck.

## 2016-12-18 NOTE — ED Notes (Signed)
Pt and family understood dc material. NAD noted 

## 2016-12-18 NOTE — ED Provider Notes (Signed)
[facility]-EMERGENCY DEPT Provider Note   CSN: [id number] Arrival date & time: 12/18/16  1828     History   Chief Complaint Chief Complaint  Patient presents with  . Foot Pain    HPI Dennis Mathis is a 51 y.o. male with a h/o of HIV (CD4 630 on 8/17) who presents to the emergency department with worsening left foot pain and swelling that began 4 days ago. He denies recent trauma or injury to the foot. He reports he has been wearing sandals because his athletic shoes no longer fit due to the swelling. No fever or chills. He reports he has been compliant with his daily HAART therapy. No treatment at home.   The history is provided by the patient. No language interpreter was used.    Past Medical History:  Diagnosis Date  . HIV (human immunodeficiency virus infection) [facility](HCC)     Patient Active Problem List   Diagnosis Date Noted  . FOLLICULITIS 11/26/2008  . DYSTHYMIC DISORDER 03/21/2007  . Backache 10/18/2006  . Human immunodeficiency virus (HIV) disease (HCC) 03/03/2006    History reviewed. No pertinent surgical history.     Home Medications    Prior to Admission medications   Medication Sig Start Date End Date Taking? Authorizing Provider  COMPLERA 200-25-300 MG tablet TAKE 1 TABLET BY MOUTH DAILY 03/10/16   Ginnie SmartHatcher, Jeffrey C, MD  HYDROcodone-acetaminophen (VICODIN) 5-500 MG per tablet Take 1 tablet by mouth every 6 (six) hours as needed.      [provider]  PREZCOBIX 800-150 MG tablet TAKE 1 TABLET BY MOUTH DAILY WITH FOOD. SWALLOW WHOLE. DO NOT CRUSH, BREAK OR CHEW TABLETS 03/10/16   Ginnie SmartHatcher, Jeffrey C, MD  sulfamethoxazole-trimethoprim (BACTRIM DS,SEPTRA DS) 800-160 MG tablet Take 1 tablet by mouth 2 (two) times daily. 12/18/16 12/25/16  Jayvian Escoe A, PA-C  triamcinolone (KENALOG) 0.1 % cream Apply 1 application topically 2 (two) times daily. Reported on 06/03/2015    [provider]    Family History History reviewed. No pertinent family  history.  Social History Social History  Substance Use Topics  . Smoking status: Current Every Day Smoker    Packs/day: 0.30    Types: Cigars  . Smokeless tobacco: Never Used     Comment: trying to cut back  . Alcohol use 0.0 oz/week     Comment: occasional     Allergies   Patient has no known allergies.   Review of Systems Review of Systems  Constitutional: Negative for activity change, chills and fever.  Respiratory: Negative for shortness of breath.   Cardiovascular: Negative for chest pain.  Gastrointestinal: Negative for abdominal pain.  Musculoskeletal: Positive for arthralgias and myalgias. Negative for back pain.  Skin: Positive for color change. Negative for rash.  Neurological: Negative for weakness and numbness.     Physical Exam Updated Vital Signs BP 128/77   Pulse 77   Temp 98.5 F (36.9 C) (Oral)   Resp 18   SpO2 100%   Physical Exam  Constitutional: He appears well-developed.  HENT:  Head: Normocephalic.  Eyes: Conjunctivae are normal.  Neck: Neck supple.  Cardiovascular: Normal rate and regular rhythm.   No murmur heard. Pulmonary/Chest: Effort normal.  Abdominal: Soft. He exhibits no distension.  Musculoskeletal:  The dorsum of the left foot is warm to the touch, mildly erythematous, and edematous. Left ankle is mildly edematous. There is a bulla with minimal serosanguineous drainage located between the fourth and fifth toes on the left foot. No gross abscess.  NVI. Good strength against resistance.   Neurological: He is alert.  Skin: Skin is warm and dry.  Psychiatric: His behavior is normal.  Nursing note and vitals reviewed.  ED Treatments / Results  Labs (all labs ordered are listed, but only abnormal results are displayed) Labs Reviewed - No data to display  EKG  EKG Interpretation None       Radiology Dg Foot Complete Left  Result Date: 12/18/2016 CLINICAL DATA:  Redness and swelling intermittent for 6 years. No trauma.  EXAM: LEFT FOOT - COMPLETE 3+ VIEW COMPARISON:  None. FINDINGS: There is no evidence of fracture or dislocation. Type 3 navicular bone. There is no evidence of arthropathy or other focal bone abnormality. Mid and forefoot soft tissue swelling without subcutaneous gas or radiopaque foreign bodies. IMPRESSION: Soft tissue swelling without acute osseous process. Electronically Signed   By: Awilda Metroourtnay  Bloomer M.D.   On: 12/18/2016 21:23    Procedures Procedures (including critical care time)  Medications Ordered in ED Medications  ibuprofen (ADVIL,MOTRIN) tablet 800 mg (800 mg Oral Given 12/18/16 2013)  sulfamethoxazole-trimethoprim (BACTRIM DS,SEPTRA DS) 800-160 MG per tablet 1 tablet (1 tablet Oral Given 12/18/16 2159)     Initial Impression / Assessment and Plan / ED Course  I have reviewed the triage vital signs and the nursing notes.  Pertinent labs & imaging results that were available during my care of the patient were reviewed by me and considered in my medical decision making (see chart for details).     Patient with a h/o of HIV, compliant with HAART therapy, presenting with non-purulent cellulitis of the left foot. CD4 count 630 on 8/17. Pt is without gross abscess for which I&D would be possible. Pt encouraged to return if redness begins to streak, extends beyond the markings, and/or fever develops.  Pt is alert, oriented, NAD, afebrile, non tachycardic, nonseptic and nontoxic appearing. Given patient's history of HIV, we'll place the patient on Bactrim for 7 days to cover for MRSA. Instructed the patient to call the infectious disease clinic and schedule a follow-up appointment within the next week since his CD4 count has not been checked within the last year and to have his left foot re-checked. Strict return precautions. No acute distress. Vital signs stable. The patient is safe for discharge at this time.   Final Clinical Impressions(s) / ED Diagnoses   Final diagnoses:  Cellulitis  of left foot    New Prescriptions Discharge Medication List as of 12/18/2016 10:01 PM    START taking these medications   Details  sulfamethoxazole-trimethoprim (BACTRIM DS,SEPTRA DS) 800-160 MG tablet Take 1 tablet by mouth 2 (two) times daily., Starting Sun 12/18/2016, Until Sun 12/25/2016, Print         Rashanda Magloire A, PA-C 12/18/16 [id number]    Tilden Fossaees, Elizabeth, MD 12/19/16 2113

## 2017-01-30 ENCOUNTER — Other Ambulatory Visit: Payer: Self-pay | Admitting: Infectious Diseases

## 2017-01-30 DIAGNOSIS — B20 Human immunodeficiency virus [HIV] disease: Secondary | ICD-10-CM

## 2017-02-06 ENCOUNTER — Other Ambulatory Visit: Payer: Medicaid Other

## 2017-02-06 DIAGNOSIS — B20 Human immunodeficiency virus [HIV] disease: Secondary | ICD-10-CM

## 2017-02-06 DIAGNOSIS — Z113 Encounter for screening for infections with a predominantly sexual mode of transmission: Secondary | ICD-10-CM

## 2017-02-07 LAB — COMPLETE METABOLIC PANEL WITH GFR
AG Ratio: 1.7 (calc) (ref 1.0–2.5)
ALKALINE PHOSPHATASE (APISO): 53 U/L (ref 40–115)
ALT: 7 U/L — AB (ref 9–46)
AST: 12 U/L (ref 10–40)
Albumin: 4.2 g/dL (ref 3.6–5.1)
BILIRUBIN TOTAL: 0.4 mg/dL (ref 0.2–1.2)
BUN: 12 mg/dL (ref 7–25)
CHLORIDE: 105 mmol/L (ref 98–110)
CO2: 29 mmol/L (ref 20–32)
Calcium: 9.3 mg/dL (ref 8.6–10.3)
Creat: 0.94 mg/dL (ref 0.60–1.35)
GFR, Est African American: 111 mL/min/{1.73_m2} (ref >=60)
GFR, Est Non African American: 95 mL/min/{1.73_m2} (ref >=60)
Globulin: 2.5 g/dL (calc) (ref 1.9–3.7)
Glucose, Bld: 98 mg/dL (ref 65–99)
POTASSIUM: 4.4 mmol/L (ref 3.5–5.3)
SODIUM: 139 mmol/L (ref 135–146)
Total Protein: 6.7 g/dL (ref 6.1–8.1)

## 2017-02-07 LAB — CBC WITH DIFFERENTIAL/PLATELET
BASOS PCT: 0.8 %
Basophils Absolute: 49 cells/uL (ref 0–200)
EOS PCT: 7.8 %
Eosinophils Absolute: 476 cells/uL (ref 15–500)
HCT: 35.8 % — ABNORMAL LOW (ref 38.5–50.0)
HEMOGLOBIN: 12 g/dL — AB (ref 13.2–17.1)
Lymphs Abs: 2269 cells/uL (ref 850–3900)
MCH: 30 pg (ref 27.0–33.0)
MCHC: 33.5 g/dL (ref 32.0–36.0)
MCV: 89.5 fL (ref 80.0–100.0)
MONOS PCT: 8.6 %
MPV: 11.1 fL (ref 7.5–12.5)
NEUTROS ABS: 2782 {cells}/uL (ref 1500–7800)
Neutrophils Relative %: 45.6 %
PLATELETS: 195 10*3/uL (ref 140–400)
RBC: 4 10*6/uL — ABNORMAL LOW (ref 4.20–5.80)
RDW: 13.8 % (ref 11.0–15.0)
TOTAL LYMPHOCYTE: 37.2 %
WBC mixed population: 525 cells/uL (ref 200–950)
WBC: 6.1 10*3/uL (ref 3.8–10.8)

## 2017-02-07 LAB — T-HELPER CELL (CD4) - (RCID CLINIC ONLY)
CD4 % Helper T Cell: 28 % — ABNORMAL LOW (ref 33–55)
CD4 T Cell Abs: 730 /uL (ref 400–2700)

## 2017-02-07 LAB — RPR: RPR Ser Ql: NONREACTIVE

## 2017-02-08 LAB — HIV-1 RNA QUANT-NO REFLEX-BLD
HIV 1 RNA Quant: <20 copies/mL
HIV-1 RNA Quant, Log: <1.3 Log copies/mL

## 2017-03-20 ENCOUNTER — Ambulatory Visit (INDEPENDENT_AMBULATORY_CARE_PROVIDER_SITE_OTHER): Payer: Medicaid Other | Admitting: Infectious Diseases

## 2017-03-20 ENCOUNTER — Encounter: Payer: Self-pay | Admitting: Infectious Diseases

## 2017-03-20 VITALS — BP 147/83 | HR 79 | Temp 97.4°F | Wt 132.0 lb

## 2017-03-20 DIAGNOSIS — Z113 Encounter for screening for infections with a predominantly sexual mode of transmission: Secondary | ICD-10-CM

## 2017-03-20 DIAGNOSIS — Z79899 Other long term (current) drug therapy: Secondary | ICD-10-CM

## 2017-03-20 DIAGNOSIS — Z23 Encounter for immunization: Secondary | ICD-10-CM

## 2017-03-20 DIAGNOSIS — B20 Human immunodeficiency virus [HIV] disease: Secondary | ICD-10-CM

## 2017-03-20 MED ORDER — EMTRICITAB-RILPIVIR-TENOFOV AF 200-25-25 MG PO TABS: 1.0000 | ORAL_TABLET | Freq: Every day | ORAL | 3 refills | Status: DC

## 2017-03-20 NOTE — Progress Notes (Signed)
   Subjective:    Patient ID: Dennis Mathis, male    DOB: 11-10-1965, 51 y.o.   MRN: [id number]  HPI 51 yo M dx HIV+ 11-16-05. Had routine test. Previously homeless. Blind in L eye.  Prev taking TRV/ISN. 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 Hep B S Ab+.  At his f/u appt 11-05-13 he had deep sequencing showing multiple drug mutations. Was changed to DRVc/complera.  Has been doing well. Taking rx on full stomach. Was seen in MD office/ED for L foot swelling. Told it was not gout. Told it was an infection. Was tx with bactrim, antifungal.   HIV 1 RNA Quant (copies/mL)  Date Value  02/06/2017 <20 NOT DETECTED  12/21/2015 <20  04/27/2015 <20   CD4 T Cell Abs (/uL)  Date Value  02/06/2017 730  12/21/2015 630  04/27/2015 770    Review of Systems  Constitutional: Negative for appetite change, chills, fever and unexpected weight change.  Eyes: Negative for visual disturbance.  Respiratory: Negative for cough and shortness of breath.   Gastrointestinal: Negative for constipation and diarrhea.  Genitourinary: Negative for difficulty urinating.  Psychiatric/Behavioral: Negative for dysphoric mood and sleep disturbance.  Please see HPI. All other systems reviewed and negative.      Objective:   Physical Exam  Constitutional: He appears well-developed and well-nourished.  HENT:  Mouth/Throat: No oropharyngeal exudate.  Eyes: EOM are normal.  Neck: Neck supple.  Cardiovascular: Normal rate, regular rhythm and normal heart sounds.  Pulmonary/Chest: Effort normal and breath sounds normal.  Abdominal: Soft. Bowel sounds are normal. There is no tenderness. There is no rebound.  Musculoskeletal: He exhibits no edema.  Lymphadenopathy:    He has no cervical adenopathy.  Psychiatric: He has a normal mood and affect.          Assessment & Plan:

## 2017-03-20 NOTE — Assessment & Plan Note (Signed)
Will change him to odefsy Given condoms Given flu shot rtc in 9 months He is doing well, adherent.

## 2017-05-29 ENCOUNTER — Other Ambulatory Visit: Payer: Self-pay | Admitting: Infectious Diseases

## 2017-05-29 DIAGNOSIS — B20 Human immunodeficiency virus [HIV] disease: Secondary | ICD-10-CM

## 2018-01-06 IMAGING — DX DG FOOT COMPLETE 3+V*L*
3 series · 3 of 3 positions shown · non-contrast
Comparison: None.

CLINICAL DATA: Redness and swelling intermittent for 6 years. No
trauma.

EXAM:
LEFT FOOT - COMPLETE 3+ VIEW

[foot ap]
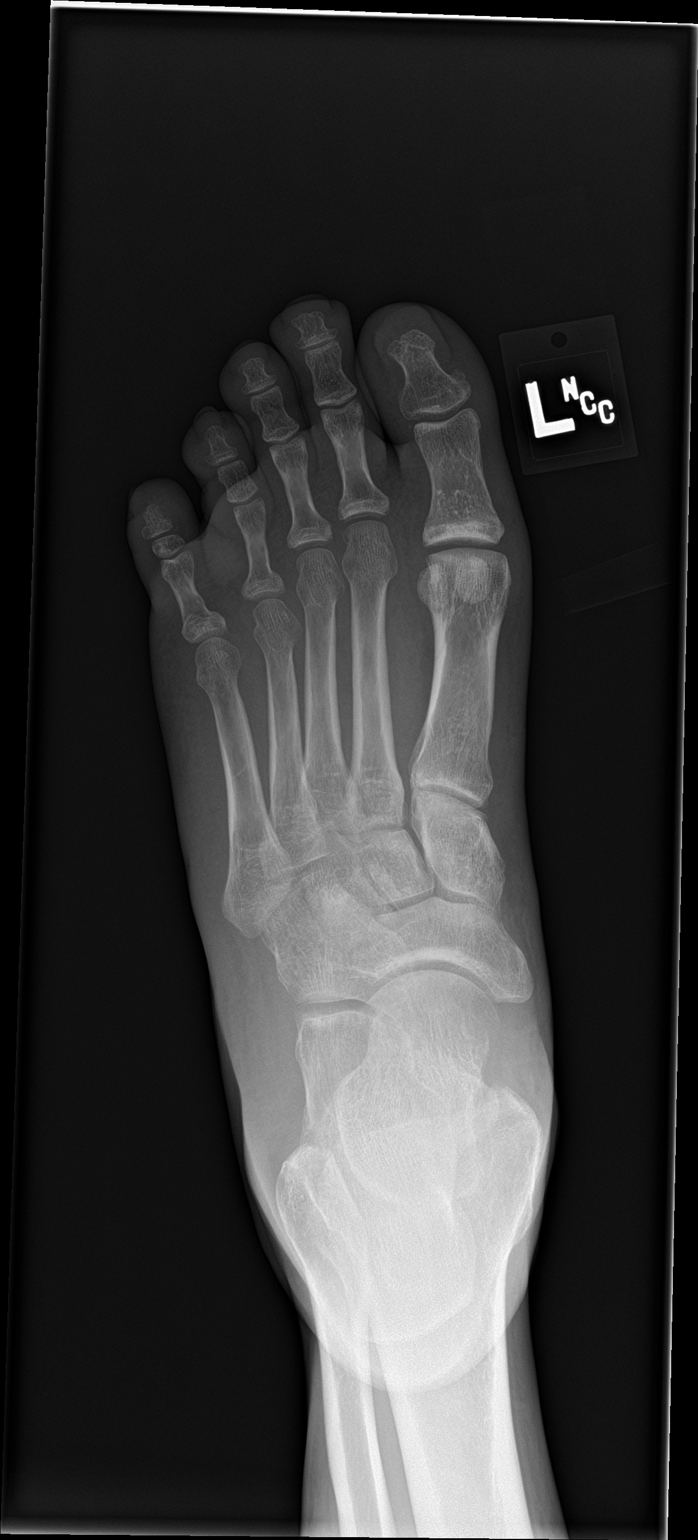

[foot obl]
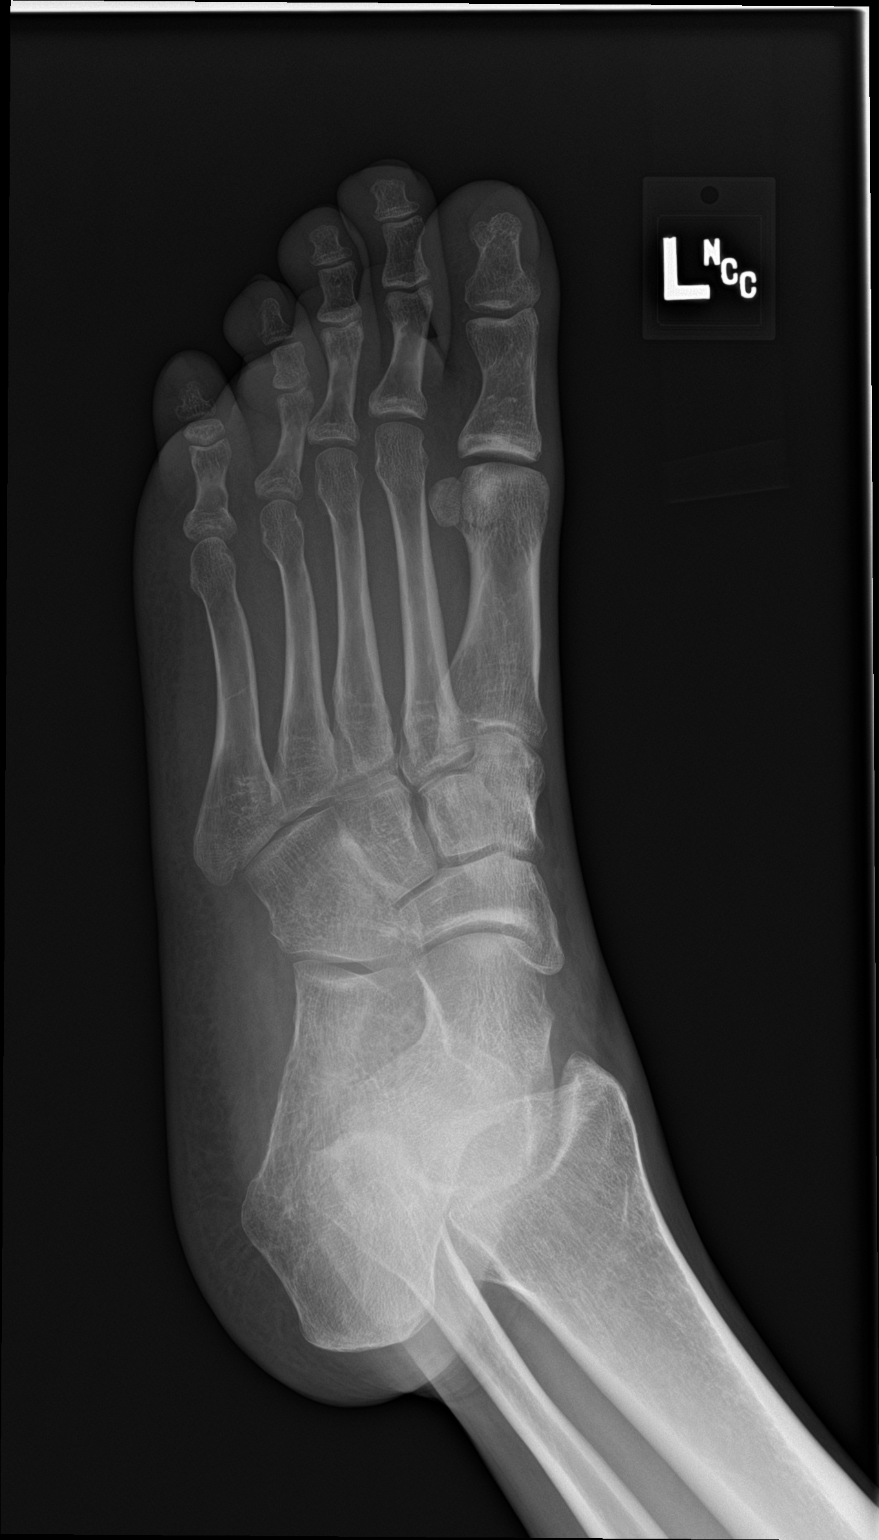

[foot lat]
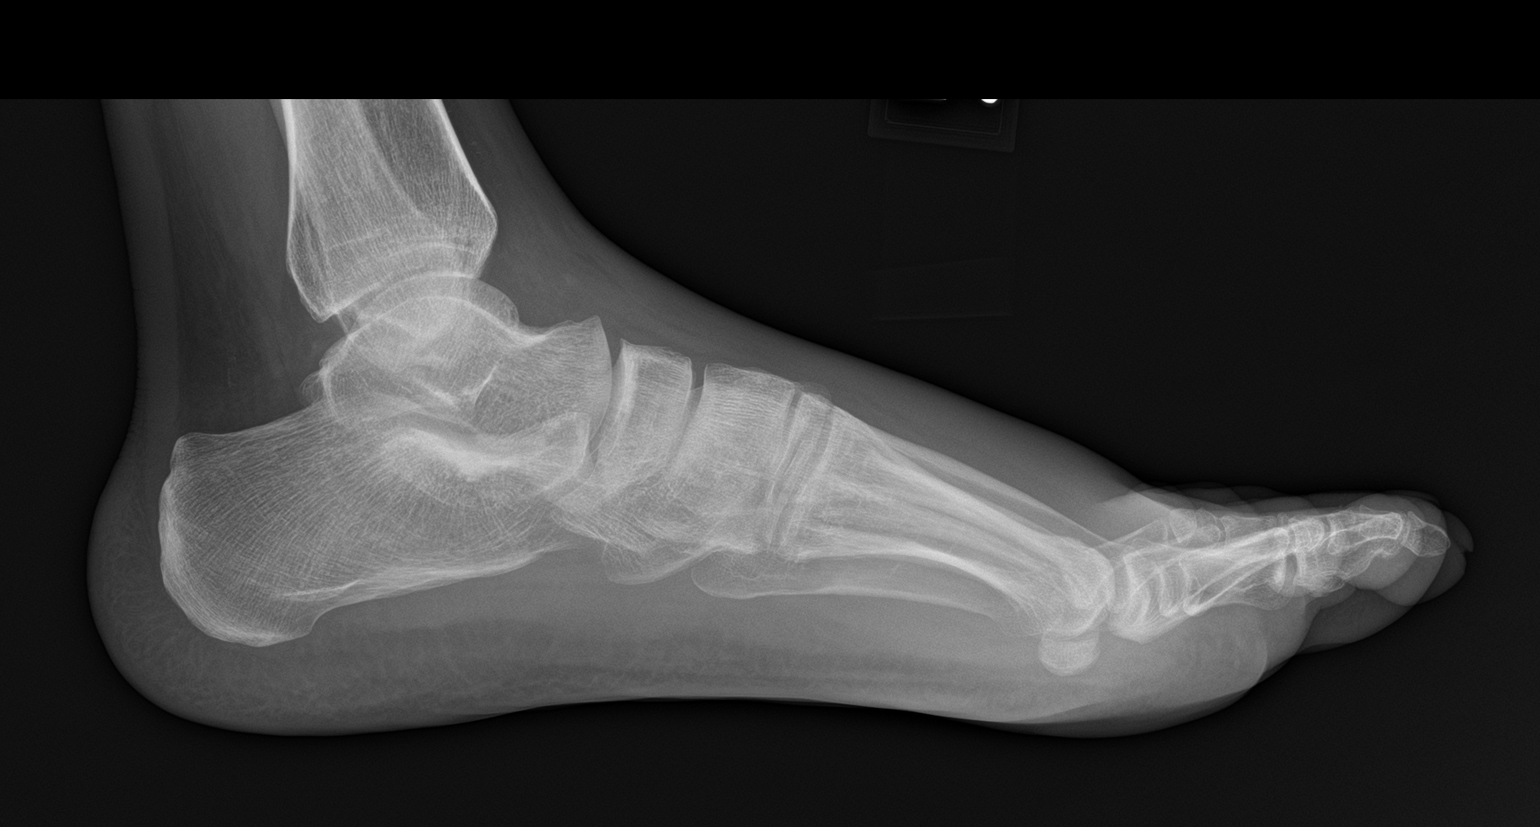

[3 of 3 positions shown; findings below may reference images not displayed]

FINDINGS: There is no evidence of fracture or dislocation. Type 3 navicular
bone. There is no evidence of arthropathy or other focal bone
abnormality. Mid and forefoot soft tissue swelling without
subcutaneous gas or radiopaque foreign bodies.
IMPRESSION: Soft tissue swelling without acute osseous process.

## 2018-03-29 ENCOUNTER — Other Ambulatory Visit: Payer: Self-pay | Admitting: Infectious Diseases

## 2018-03-29 DIAGNOSIS — B20 Human immunodeficiency virus [HIV] disease: Secondary | ICD-10-CM

## 2018-04-30 ENCOUNTER — Telehealth: Payer: Self-pay

## 2018-04-30 ENCOUNTER — Other Ambulatory Visit: Payer: Self-pay | Admitting: Infectious Diseases

## 2018-04-30 DIAGNOSIS — B20 Human immunodeficiency virus [HIV] disease: Secondary | ICD-10-CM

## 2018-04-30 NOTE — Telephone Encounter (Signed)
Patient is overdue for an appointment for lab work and office visit with Dr. Ninetta LightsHatcher. Patient last seen on 03/20/17. Attempted to call patient to schedule appointments, but was sent to voicemail. Left message for patient to call office back. Lorenso CourierJose L Stanislaw [place], [place]

## 2018-10-20 ENCOUNTER — Other Ambulatory Visit: Payer: Self-pay | Admitting: Infectious Diseases

## 2018-10-20 DIAGNOSIS — B20 Human immunodeficiency virus [HIV] disease: Secondary | ICD-10-CM

## 2019-08-12 ENCOUNTER — Other Ambulatory Visit: Payer: Self-pay

## 2019-08-12 ENCOUNTER — Other Ambulatory Visit: Payer: Self-pay | Admitting: *Deleted

## 2019-08-12 ENCOUNTER — Other Ambulatory Visit: Payer: Medicaid Other

## 2019-08-12 DIAGNOSIS — B20 Human immunodeficiency virus [HIV] disease: Secondary | ICD-10-CM

## 2019-08-12 DIAGNOSIS — Z113 Encounter for screening for infections with a predominantly sexual mode of transmission: Secondary | ICD-10-CM

## 2019-08-12 DIAGNOSIS — Z79899 Other long term (current) drug therapy: Secondary | ICD-10-CM

## 2019-08-13 LAB — T-HELPER CELL (CD4) - (RCID CLINIC ONLY)
CD4 % Helper T Cell: 21 % — ABNORMAL LOW (ref 33–65)
CD4 T Cell Abs: 573 /uL (ref 400–1790)

## 2019-08-24 ENCOUNTER — Encounter: Payer: Self-pay | Admitting: Infectious Diseases

## 2019-08-24 DIAGNOSIS — Z789 Other specified health status: Secondary | ICD-10-CM | POA: Insufficient documentation

## 2019-08-24 LAB — QUANTIFERON-TB GOLD PLUS
Mitogen-NIL: 7.98 IU/mL
NIL: 0.06 IU/mL
QuantiFERON-TB Gold Plus: NEGATIVE
TB1-NIL: <0 IU/mL
TB2-NIL: <0 IU/mL

## 2019-08-24 LAB — HEPATITIS B SURFACE ANTIBODY,QUALITATIVE: Hep B S Ab: REACTIVE — AB

## 2019-08-24 LAB — LIPID PANEL
Cholesterol: 128 mg/dL (ref <200)
HDL: 42 mg/dL (ref >=40)
LDL Cholesterol (Calc): 71 mg/dL (calc)
Non-HDL Cholesterol (Calc): 86 mg/dL (calc) (ref <130)
Total CHOL/HDL Ratio: 3 (calc) (ref <5.0)
Triglycerides: 69 mg/dL (ref <150)

## 2019-08-24 LAB — CBC WITH DIFFERENTIAL/PLATELET
Absolute Monocytes: 531 cells/uL (ref 200–950)
Basophils Absolute: 41 cells/uL (ref 0–200)
Basophils Relative: 0.7 %
Eosinophils Absolute: 201 cells/uL (ref 15–500)
Eosinophils Relative: 3.4 %
HCT: 37.5 % — ABNORMAL LOW (ref 38.5–50.0)
Hemoglobin: 12.2 g/dL — ABNORMAL LOW (ref 13.2–17.1)
Lymphs Abs: 2744 cells/uL (ref 850–3900)
MCH: 28.8 pg (ref 27.0–33.0)
MCHC: 32.5 g/dL (ref 32.0–36.0)
MCV: 88.4 fL (ref 80.0–100.0)
MPV: 11.7 fL (ref 7.5–12.5)
Monocytes Relative: 9 %
Neutro Abs: 2384 cells/uL (ref 1500–7800)
Neutrophils Relative %: 40.4 %
Platelets: 181 10*3/uL (ref 140–400)
RBC: 4.24 10*6/uL (ref 4.20–5.80)
RDW: 14.3 % (ref 11.0–15.0)
Total Lymphocyte: 46.5 %
WBC: 5.9 10*3/uL (ref 3.8–10.8)

## 2019-08-24 LAB — COMPLETE METABOLIC PANEL WITH GFR
AG Ratio: 1 (calc) (ref 1.0–2.5)
ALT: 21 U/L (ref 9–46)
AST: 18 U/L (ref 10–35)
Albumin: 4.2 g/dL (ref 3.6–5.1)
Alkaline phosphatase (APISO): 57 U/L (ref 35–144)
BUN: 15 mg/dL (ref 7–25)
CO2: 26 mmol/L (ref 20–32)
Calcium: 9.7 mg/dL (ref 8.6–10.3)
Chloride: 104 mmol/L (ref 98–110)
Creat: 1 mg/dL (ref 0.70–1.33)
GFR, Est African American: 101 mL/min/{1.73_m2} (ref >=60)
GFR, Est Non African American: 87 mL/min/{1.73_m2} (ref >=60)
Globulin: 4.2 g/dL (calc) — ABNORMAL HIGH (ref 1.9–3.7)
Glucose, Bld: 82 mg/dL (ref 65–99)
Potassium: 3.8 mmol/L (ref 3.5–5.3)
Sodium: 136 mmol/L (ref 135–146)
Total Bilirubin: 0.3 mg/dL (ref 0.2–1.2)
Total Protein: 8.4 g/dL — ABNORMAL HIGH (ref 6.1–8.1)

## 2019-08-24 LAB — HEPATITIS C ANTIBODY
Hepatitis C Ab: NONREACTIVE
SIGNAL TO CUT-OFF: 0.14 (ref <1.00)

## 2019-08-24 LAB — HEPATITIS B SURFACE ANTIGEN: Hepatitis B Surface Ag: NONREACTIVE

## 2019-08-24 LAB — RPR: RPR Ser Ql: NONREACTIVE

## 2019-08-24 LAB — HIV-1 RNA ULTRAQUANT REFLEX TO GENTYP+
HIV 1 RNA Quant: 6210 copies/mL — ABNORMAL HIGH
HIV-1 RNA Quant, Log: 3.79 Log copies/mL — ABNORMAL HIGH

## 2019-08-24 LAB — HEPATITIS A ANTIBODY, TOTAL: Hepatitis A AB,Total: NONREACTIVE

## 2019-08-24 LAB — HEPATITIS B CORE ANTIBODY, TOTAL: Hep B Core Total Ab: REACTIVE — AB

## 2019-08-24 LAB — HLA B*5701: HLA-B*5701 w/rflx HLA-B High: NEGATIVE

## 2019-08-24 LAB — HIV-1 GENOTYPE: HIV-1 Genotype: DETECTED — AB

## 2019-08-29 ENCOUNTER — Encounter: Payer: Self-pay | Admitting: Infectious Diseases

## 2019-08-29 ENCOUNTER — Ambulatory Visit (INDEPENDENT_AMBULATORY_CARE_PROVIDER_SITE_OTHER): Payer: Medicaid Other | Admitting: Infectious Diseases

## 2019-08-29 ENCOUNTER — Other Ambulatory Visit: Payer: Self-pay

## 2019-08-29 DIAGNOSIS — B20 Human immunodeficiency virus [HIV] disease: Secondary | ICD-10-CM

## 2019-08-29 MED ORDER — ONDANSETRON HCL 4 MG PO TABS: 4.0000 mg | ORAL_TABLET | Freq: Three times a day (TID) | ORAL | 1 refills | Status: DC | PRN

## 2019-08-29 NOTE — Progress Notes (Signed)
   Subjective:    Patient ID: Dennis Mathis, male    DOB: 1965-07-03, 54 y.o.   MRN: [id number]  HPI 54 yo M dx HIV+ 11-16-05. Had routine test. Previously homeless. Blind in L eye.  Prev taking TRV/ISN. 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 Hep B S Ab+.  At his f/u appt 11-05-13 he had deep sequencing showing multiple drug mutations. Was changed to DRVc/complera then to odefsy/prezcobix 2018.   Has been having nausea/vomitting with his meds. Takes qod when he has n/v.  No change with his meds. 130-135.  Has not gotten OCVID vax yet, family members will be deciding.    HIV 1 RNA Quant (copies/mL)  Date Value  08/12/2019 6,210 (H)  02/06/2017 <20 NOT DETECTED  12/21/2015 <20   CD4 T Cell Abs (/uL)  Date Value  08/12/2019 573  02/06/2017 730  12/21/2015 630    Review of Systems  Constitutional: Negative for appetite change and unexpected weight change.  Gastrointestinal: Positive for nausea and vomiting. Negative for constipation and diarrhea.  Genitourinary: Negative for difficulty urinating.  Neurological: Positive for headaches.  Psychiatric/Behavioral: Negative for sleep disturbance.       Objective:   Physical Exam Constitutional:      Appearance: Normal appearance. He is not ill-appearing or diaphoretic.  HENT:     Mouth/Throat:     Mouth: Mucous membranes are moist.     Pharynx: No oropharyngeal exudate.  Cardiovascular:     Rate and Rhythm: Normal rate and regular rhythm.  Pulmonary:     Effort: Pulmonary effort is normal.     Breath sounds: Normal breath sounds.  Abdominal:     General: Bowel sounds are normal. There is no distension.     Palpations: Abdomen is soft.     Tenderness: There is no abdominal tenderness.  Musculoskeletal:        General: No swelling.     Cervical back: Normal range of motion and neck supple.     Right lower leg: No edema.     Left lower leg: No edema.  Neurological:     Mental Status: He is alert.    Psychiatric:        Mood and Affect: Mood normal.           Assessment & Plan:

## 2019-08-29 NOTE — Assessment & Plan Note (Addendum)
Will try to see if he can change timing of art Will give him rx for zofran as well.  Given condoms pcv 13 defered by pt today.  rtc in 3 months.  Encouraged him to get COVID vax.

## 2019-09-17 ENCOUNTER — Telehealth: Payer: Self-pay | Admitting: Infectious Diseases

## 2019-09-17 NOTE — Telephone Encounter (Signed)
Patient labs drawn 08/12/19, results available.  Patient seen 08/29/19 by Dr. Ninetta Lights.  Patient asking for refill of Odefsey and Prezcobix prior to return OV on 11/28/19.  Dr. Ninetta Lights please advise.

## 2019-09-17 NOTE — Telephone Encounter (Signed)
Please refill. Thanks!

## 2019-10-16 ENCOUNTER — Other Ambulatory Visit: Payer: Self-pay | Admitting: *Deleted

## 2019-10-16 DIAGNOSIS — B20 Human immunodeficiency virus [HIV] disease: Secondary | ICD-10-CM

## 2019-10-16 MED ORDER — ODEFSEY 200-25-25 MG PO TABS: 1.0000 | ORAL_TABLET | Freq: Every day | ORAL | 2 refills | Status: DC

## 2019-10-16 MED ORDER — PREZCOBIX 800-150 MG PO TABS: ORAL_TABLET | ORAL | 2 refills | Status: DC

## 2019-10-16 NOTE — Telephone Encounter (Signed)
Patient called back. He has missed 2 weeks of prezcobix and odefsey. OK per Cassie to restart, will follow up with labwork as needed at July appointment. Refills sent. Andree Coss, RN

## 2019-11-28 ENCOUNTER — Ambulatory Visit: Payer: Medicaid Other | Admitting: Infectious Diseases

## 2019-12-05 ENCOUNTER — Ambulatory Visit (INDEPENDENT_AMBULATORY_CARE_PROVIDER_SITE_OTHER): Payer: Medicaid Other | Admitting: Infectious Diseases

## 2019-12-05 ENCOUNTER — Encounter: Payer: Self-pay | Admitting: Infectious Diseases

## 2019-12-05 ENCOUNTER — Other Ambulatory Visit: Payer: Self-pay

## 2019-12-05 VITALS — BP 127/77 | HR 67 | Temp 98.2°F | Wt 123.6 lb

## 2019-12-05 DIAGNOSIS — Z79899 Other long term (current) drug therapy: Secondary | ICD-10-CM | POA: Diagnosis not present

## 2019-12-05 DIAGNOSIS — Z113 Encounter for screening for infections with a predominantly sexual mode of transmission: Secondary | ICD-10-CM | POA: Diagnosis not present

## 2019-12-05 DIAGNOSIS — B20 Human immunodeficiency virus [HIV] disease: Secondary | ICD-10-CM

## 2019-12-05 NOTE — Assessment & Plan Note (Signed)
He is doing better Will check his CD4 and HIV RNA Will send for colon Given condoms PSV13 today rtc in 6 months Labs in 6 months.

## 2019-12-05 NOTE — Progress Notes (Signed)
   Subjective:    Patient ID: Dennis Mathis, male    DOB: 16-Aug-1965, 54 y.o.   MRN: [id number]  HPI 53yo M dx HIV+ 11-16-05. Had routine test. Previously homeless. Blind in L eye.  Prev taking TRV/ISN. At labhadsignificant integrase inhibitor resistance. Hep B S Ab+.  At his f/u appt 11-05-13 he had deep sequencing showing multiple drug mutations. Was changed to darunavir-cobisistat/complera then to odefsy/prezcobix 2018.   He has not gotten his COVID vax.  He states his medicine is going "better". Has been able to monitor his diet better around this and he is better able to keep his meds down. No more fried foods in the AM.  His wt has been "fluctuating". No prev marinol or megace.   HIV 1 RNA Quant (copies/mL)  Date Value  08/12/2019 6,210 (H)  02/06/2017 <20 NOT DETECTED  12/21/2015 <20   CD4 T Cell Abs (/uL)  Date Value  08/12/2019 573  02/06/2017 730  12/21/2015 630    Review of Systems  Constitutional: Negative for appetite change and unexpected weight change.  Gastrointestinal: Negative for abdominal pain, constipation and diarrhea.  Genitourinary: Negative for difficulty urinating.  Please see HPI. All other systems reviewed and negative.      Objective:   Physical Exam Vitals reviewed.  Constitutional:      General: He is not in acute distress.    Appearance: He is not toxic-appearing.  HENT:     Mouth/Throat:     Mouth: Mucous membranes are moist.     Pharynx: No oropharyngeal exudate.  Eyes:     Extraocular Movements: Extraocular movements intact.     Pupils: Pupils are equal, round, and reactive to light.  Cardiovascular:     Rate and Rhythm: Normal rate and regular rhythm.  Pulmonary:     Effort: Pulmonary effort is normal.     Breath sounds: Normal breath sounds.  Abdominal:     General: Bowel sounds are normal. There is no distension.     Palpations: Abdomen is soft.     Tenderness: There is no abdominal tenderness.  Musculoskeletal:          General: Normal range of motion.     Cervical back: Normal range of motion and neck supple.     Right lower leg: No edema.     Left lower leg: No edema.  Skin:    General: Skin is warm.  Neurological:     Mental Status: He is alert.  Psychiatric:        Mood and Affect: Mood normal.           Assessment & Plan:

## 2019-12-17 ENCOUNTER — Other Ambulatory Visit: Payer: Self-pay

## 2019-12-17 DIAGNOSIS — Z23 Encounter for immunization: Secondary | ICD-10-CM

## 2019-12-23 ENCOUNTER — Telehealth: Payer: Self-pay | Admitting: *Deleted

## 2019-12-23 NOTE — Telephone Encounter (Signed)
Patient called to follow up on "weight gaining pill" that was discussed at his last office visit. Please advise. Patient uses [facility] at [facility]. Andree Coss, RN

## 2020-05-19 ENCOUNTER — Other Ambulatory Visit: Payer: Medicaid Other

## 2020-05-20 ENCOUNTER — Other Ambulatory Visit: Payer: Self-pay

## 2020-05-20 ENCOUNTER — Other Ambulatory Visit: Payer: Medicaid Other

## 2020-05-20 DIAGNOSIS — Z79899 Other long term (current) drug therapy: Secondary | ICD-10-CM

## 2020-05-20 DIAGNOSIS — Z113 Encounter for screening for infections with a predominantly sexual mode of transmission: Secondary | ICD-10-CM

## 2020-05-20 DIAGNOSIS — B20 Human immunodeficiency virus [HIV] disease: Secondary | ICD-10-CM

## 2020-05-21 LAB — T-HELPER CELL (CD4) - (RCID CLINIC ONLY)
CD4 % Helper T Cell: 20 % — ABNORMAL LOW (ref 33–65)
CD4 T Cell Abs: 467 /uL (ref 400–1790)

## 2020-05-27 LAB — COMPREHENSIVE METABOLIC PANEL
AG Ratio: 1 (calc) (ref 1.0–2.5)
ALT: 7 U/L — ABNORMAL LOW (ref 9–46)
AST: 13 U/L (ref 10–35)
Albumin: 3.8 g/dL (ref 3.6–5.1)
Alkaline phosphatase (APISO): 54 U/L (ref 35–144)
BUN: 10 mg/dL (ref 7–25)
CO2: 31 mmol/L (ref 20–32)
Calcium: 9.4 mg/dL (ref 8.6–10.3)
Chloride: 104 mmol/L (ref 98–110)
Creat: 0.95 mg/dL (ref 0.70–1.33)
Globulin: 3.8 g/dL (calc) — ABNORMAL HIGH (ref 1.9–3.7)
Glucose, Bld: 74 mg/dL (ref 65–99)
Potassium: 4.1 mmol/L (ref 3.5–5.3)
Sodium: 138 mmol/L (ref 135–146)
Total Bilirubin: 0.3 mg/dL (ref 0.2–1.2)
Total Protein: 7.6 g/dL (ref 6.1–8.1)

## 2020-05-27 LAB — HIV-1 RNA QUANT-NO REFLEX-BLD
HIV 1 RNA Quant: 3070 Copies/mL — ABNORMAL HIGH
HIV-1 RNA Quant, Log: 3.49 Log cps/mL — ABNORMAL HIGH

## 2020-05-27 LAB — CBC
HCT: 33.9 % — ABNORMAL LOW (ref 38.5–50.0)
Hemoglobin: 11.3 g/dL — ABNORMAL LOW (ref 13.2–17.1)
MCH: 29.7 pg (ref 27.0–33.0)
MCHC: 33.3 g/dL (ref 32.0–36.0)
MCV: 89 fL (ref 80.0–100.0)
MPV: 11.1 fL (ref 7.5–12.5)
Platelets: 136 10*3/uL — ABNORMAL LOW (ref 140–400)
RBC: 3.81 10*6/uL — ABNORMAL LOW (ref 4.20–5.80)
RDW: 14 % (ref 11.0–15.0)
WBC: 4.3 10*3/uL (ref 3.8–10.8)

## 2020-05-27 LAB — LIPID PANEL
Cholesterol: 113 mg/dL (ref <200)
HDL: 42 mg/dL (ref >=40)
LDL Cholesterol (Calc): 59 mg/dL (calc)
Non-HDL Cholesterol (Calc): 71 mg/dL (calc) (ref <130)
Total CHOL/HDL Ratio: 2.7 (calc) (ref <5.0)
Triglycerides: 42 mg/dL (ref <150)

## 2020-05-27 LAB — RPR: RPR Ser Ql: NONREACTIVE

## 2020-06-02 ENCOUNTER — Encounter: Payer: Medicaid Other | Admitting: Infectious Diseases

## 2020-06-09 ENCOUNTER — Ambulatory Visit (INDEPENDENT_AMBULATORY_CARE_PROVIDER_SITE_OTHER): Payer: Medicaid Other | Admitting: Infectious Diseases

## 2020-06-09 ENCOUNTER — Encounter: Payer: Self-pay | Admitting: Infectious Diseases

## 2020-06-09 ENCOUNTER — Other Ambulatory Visit: Payer: Self-pay

## 2020-06-09 DIAGNOSIS — B20 Human immunodeficiency virus [HIV] disease: Secondary | ICD-10-CM | POA: Diagnosis present
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
## 2020-06-09 NOTE — Assessment & Plan Note (Signed)
He is detectable.  Asked him to split up his medications to see if we can determine which is upsetting his GI system He is given condoms Vax: needs PCV 23, flu vax.  Will see him back in 2 months with labs prior.

## 2020-06-26 ENCOUNTER — Other Ambulatory Visit: Payer: Self-pay | Admitting: Infectious Diseases

## 2020-06-26 ENCOUNTER — Telehealth: Payer: Self-pay

## 2020-06-26 ENCOUNTER — Other Ambulatory Visit: Payer: Self-pay

## 2020-06-26 DIAGNOSIS — B20 Human immunodeficiency virus [HIV] disease: Secondary | ICD-10-CM

## 2020-06-26 MED ORDER — ONDANSETRON HCL 4 MG PO TABS
4.0000 mg | ORAL_TABLET | Freq: Three times a day (TID) | ORAL | 1 refills | Status: DC | PRN
Start: 2020-06-26 — End: 2020-06-26

## 2020-06-26 MED ORDER — ONDANSETRON HCL 4 MG PO TABS: 4.0000 mg | ORAL_TABLET | Freq: Three times a day (TID) | ORAL | 1 refills | Status: DC | PRN

## 2020-06-26 NOTE — Telephone Encounter (Signed)
Patient called office today stating he has been experiencing nausea/ vomiting after taking Prezcobix. States that when he takes his Charlett Lango he feels fine. Would like to know what MD recommends he do to help with issue. Would like to discuss medication change.

## 2020-06-26 NOTE — Telephone Encounter (Signed)
Will send in rx fo rzofran to see if we can get him better.  Otherwise will need to consider alternate ART (atatzanavir-norvir-odefsey or newer agent)

## 2020-08-11 ENCOUNTER — Ambulatory Visit: Payer: Medicaid Other | Admitting: Infectious Diseases

## 2020-10-06 ENCOUNTER — Telehealth: Payer: Self-pay

## 2020-10-06 NOTE — Telephone Encounter (Signed)
Attempted to call patient to schedule overdue follow up appointment, no answer and no secure voicemail set up.   Sandie Ano, RN

## 2020-10-26 ENCOUNTER — Other Ambulatory Visit: Payer: Self-pay | Admitting: Family

## 2020-10-26 DIAGNOSIS — B20 Human immunodeficiency virus [HIV] disease: Secondary | ICD-10-CM

## 2020-10-29 NOTE — Telephone Encounter (Signed)
Called patient again for follow up appointment, no answer.   Sandie Ano, RN

## 2021-03-08 ENCOUNTER — Telehealth: Payer: Self-pay

## 2021-03-08 NOTE — Telephone Encounter (Signed)
Called patient to schedule overdue appointment, no answer and no secure voicemail.   Sandie Ano, RN

## 2021-06-03 ENCOUNTER — Telehealth: Payer: Self-pay

## 2021-06-03 NOTE — Telephone Encounter (Signed)
Called patient to offer overdue follow up visit, no answer and unable to leave voicemail.  Beryle Flock, RN

## 2021-10-17 ENCOUNTER — Encounter: Payer: Self-pay | Admitting: Infectious Diseases

## 2022-11-30 ENCOUNTER — Ambulatory Visit (INDEPENDENT_AMBULATORY_CARE_PROVIDER_SITE_OTHER): Payer: Medicaid Other | Admitting: Infectious Diseases

## 2022-11-30 ENCOUNTER — Other Ambulatory Visit (HOSPITAL_COMMUNITY)
Admission: RE | Admit: 2022-11-30 | Discharge: 2022-11-30 | Disposition: A | Discharge status: Home / Self Care | Payer: Medicaid Other | Source: Ambulatory Visit | Attending: Infectious Diseases | Admitting: Infectious Diseases

## 2022-11-30 ENCOUNTER — Other Ambulatory Visit: Payer: Self-pay

## 2022-11-30 ENCOUNTER — Encounter: Payer: Self-pay | Admitting: Infectious Diseases

## 2022-11-30 VITALS — BP 129/78 | HR 93 | Temp 97.6°F | Resp 24 | Ht 67.0 in | Wt 124.0 lb

## 2022-11-30 DIAGNOSIS — Z113 Encounter for screening for infections with a predominantly sexual mode of transmission: Secondary | ICD-10-CM

## 2022-11-30 DIAGNOSIS — B20 Human immunodeficiency virus [HIV] disease: Secondary | ICD-10-CM | POA: Diagnosis present

## 2022-11-30 DIAGNOSIS — Z79899 Other long term (current) drug therapy: Secondary | ICD-10-CM

## 2022-11-30 MED ORDER — PREZCOBIX 800-150 MG PO TABS
ORAL_TABLET | ORAL | 2 refills | Status: AC
Start: 2022-11-30 — End: ?

## 2022-11-30 MED ORDER — ODEFSEY 200-25-25 MG PO TABS
1.0000 | ORAL_TABLET | Freq: Every day | ORAL | 2 refills | Status: AC
Start: 2022-11-30 — End: ?

## 2022-11-30 MED ORDER — ONDANSETRON HCL 4 MG PO TABS
4.0000 mg | ORAL_TABLET | Freq: Three times a day (TID) | ORAL | 1 refills | Status: DC | PRN
Start: 2022-11-30 — End: 2023-07-26

## 2022-11-30 NOTE — Assessment & Plan Note (Addendum)
Will refer him to [facility] for housing, ensure Will refill his zofran to see if that will help with GI upset.  Refill his ART Check his labs today See him back in 3 months.  Given condoms

## 2022-11-30 NOTE — Progress Notes (Signed)
   Subjective:    Patient ID: Dennis Mathis, male  DOB: 05-08-1966, 57 y.o.        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 Had routine test. Previously homeless. Blind in L eye.   Prev taking TRV/ISN. 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 Hep B S Ab+.   At his f/u appt 11-05-13 he had deep sequencing showing multiple drug mutations.  Was changed to darunavir-cobisistat/complera then to odefsy/prezcobix 2018.   Has been feeling well. Still has some issues with n/v related to his ART.  He is not sure which of his 2 medications upset his stomach.   Is feeling well.  Is planning on going on a cruise this November. [place], [place].  Has been taking his ART. Still occas has some occasional GI upset. Has not separated timing to determine which gives him GI upset yet.  Housing is better but still looking for permanent placement. Has not been to [facility] (for [facility]).   HIV 1 RNA Quant  Date Value  05/20/2020 3,070 Copies/mL (H)  08/12/2019 6,210 copies/mL (H)  02/06/2017 <20 NOT DETECTED copies/mL   CD4 T Cell Abs (/uL)  Date Value  05/20/2020 467  08/12/2019 573  02/06/2017 730     Health Maintenance  Topic Date Due  . DTaP/Tdap/Td (1 - Tdap) Never done  . Zoster Vaccines- Shingrix (1 of 2) Never done  . Colonoscopy  Never done  . COVID-19 Vaccine (3 - Pfizer risk series) 02/14/2020  . INFLUENZA VACCINE  12/15/2022  . Hepatitis C Screening  Completed  . HIV Screening  Completed  . HPV VACCINES  Aged Out      Review of Systems  Constitutional:  Negative for chills, fever and weight loss.  Respiratory:  Negative for hemoptysis and shortness of breath.   Cardiovascular:  Negative for chest pain.  Gastrointestinal:  Positive for nausea and vomiting. Negative for abdominal pain, constipation and diarrhea.  Genitourinary:  Negative for dysuria.  Psychiatric/Behavioral:  The patient does not have insomnia.    Please see HPI. All other systems reviewed and  negative.     Objective:  Physical Exam Vitals reviewed.  Constitutional:      Appearance: Normal appearance.  HENT:     Mouth/Throat:     Mouth: Mucous membranes are moist.     Pharynx: No oropharyngeal exudate.  Eyes:     Extraocular Movements: Extraocular movements intact.  Cardiovascular:     Rate and Rhythm: Normal rate and regular rhythm.  Pulmonary:     Effort: Pulmonary effort is normal.     Breath sounds: Normal breath sounds.  Abdominal:     General: Bowel sounds are normal. There is no distension.     Palpations: Abdomen is soft.     Tenderness: There is no abdominal tenderness.  Musculoskeletal:        General: Normal range of motion.     Cervical back: Normal range of motion and neck supple.     Right lower leg: No edema.     Left lower leg: No edema.  Neurological:     General: No focal deficit present.     Mental Status: He is alert.  Psychiatric:        Mood and Affect: Mood normal.          Assessment & Plan:

## 2022-12-01 LAB — T-HELPER CELLS (CD4) COUNT (NOT AT ARMC)
CD4 % Helper T Cell: 20 % — ABNORMAL LOW (ref 33–65)
CD4 T Cell Abs: 356 /uL — ABNORMAL LOW (ref 400–1790)

## 2022-12-01 LAB — URINE CYTOLOGY ANCILLARY ONLY
Chlamydia: NEGATIVE
Comment: NEGATIVE
Comment: NORMAL
Neisseria Gonorrhea: NEGATIVE

## 2022-12-02 LAB — CBC
Hematocrit: 35.9 % — ABNORMAL LOW (ref 37.5–51.0)
Hemoglobin: 11.7 g/dL — ABNORMAL LOW (ref 13.0–17.7)
MCH: 28.5 pg (ref 26.6–33.0)
MCHC: 32.6 g/dL (ref 31.5–35.7)
MCV: 87 fL (ref 79–97)
Platelets: 186 10*3/uL (ref 150–450)
RBC: 4.11 x10E6/uL — ABNORMAL LOW (ref 4.14–5.80)
RDW: 14.2 % (ref 11.6–15.4)
WBC: 5.4 10*3/uL (ref 3.4–10.8)

## 2022-12-02 LAB — COMPREHENSIVE METABOLIC PANEL
ALT: 7 IU/L (ref 0–44)
AST: 16 IU/L (ref 0–40)
Albumin: 3.8 g/dL (ref 3.8–4.9)
Alkaline Phosphatase: 80 IU/L (ref 44–121)
BUN/Creatinine Ratio: 13 (ref 9–20)
BUN: 14 mg/dL (ref 6–24)
Bilirubin Total: 0.3 mg/dL (ref 0.0–1.2)
CO2: 23 mmol/L (ref 20–29)
Calcium: 9.2 mg/dL (ref 8.7–10.2)
Chloride: 101 mmol/L (ref 96–106)
Creatinine, Ser: 1.06 mg/dL (ref 0.76–1.27)
Globulin, Total: 4.3 g/dL (ref 1.5–4.5)
Glucose: 95 mg/dL (ref 70–99)
Potassium: 4 mmol/L (ref 3.5–5.2)
Sodium: 138 mmol/L (ref 134–144)
Total Protein: 8.1 g/dL (ref 6.0–8.5)
eGFR: 83 mL/min/{1.73_m2} (ref >59)

## 2022-12-02 LAB — HIV-1 RNA ULTRAQUANT REFLEX TO GENTYP+
HIV1 RNA # SerPl PCR: 2800 copies/mL
HIV1 RNA Plas PCR-Log#: 3.447 log10copy/mL

## 2022-12-02 LAB — REFLEX TO GENOSURE(R) MG

## 2022-12-02 LAB — RPR: RPR Ser Ql: NONREACTIVE

## 2023-07-18 ENCOUNTER — Encounter: Payer: Medicaid Other | Admitting: Infectious Diseases

## 2023-07-19 ENCOUNTER — Telehealth: Payer: Self-pay | Admitting: *Deleted

## 2023-07-19 ENCOUNTER — Encounter: Admitting: Infectious Diseases

## 2023-07-19 ENCOUNTER — Other Ambulatory Visit: Payer: Self-pay | Admitting: Infectious Diseases

## 2023-07-19 DIAGNOSIS — Z113 Encounter for screening for infections with a predominantly sexual mode of transmission: Secondary | ICD-10-CM

## 2023-07-19 DIAGNOSIS — B20 Human immunodeficiency virus [HIV] disease: Secondary | ICD-10-CM

## 2023-07-19 DIAGNOSIS — Z79899 Other long term (current) drug therapy: Secondary | ICD-10-CM

## 2023-07-19 NOTE — Telephone Encounter (Signed)
 I called who stated he canceled his appt today and wants to re-schedule for next week. Call transferred to the front office - lab appt schedule 07/20/23 and he will see Dr Ninetta Lights 3/12. Dr Ninetta Lights can you place lab orders?  Thanks

## 2023-07-19 NOTE — Addendum Note (Signed)
 Addended by: Bufford Spikes on: 07/19/2023 03:07 PM   Modules accepted: Orders

## 2023-07-19 NOTE — Telephone Encounter (Signed)
 Communication  Reason for CRM: Agent attempted to schedule patient for a check up appointment. Patient stated he just needs to come in and speak with Dr. Ninetta Lights and get some labs drawn. The patient expressed he is not experiencing anything concerning at this time and would just like a check up. Agent informed Patient I was having trouble scheduling him and that I would send in a message and someone will reach back out.        Called [facility] and they informed me sometimes a Lab appt is needed prior to the patient being seen not sure if that was the issue or not.        Callback number: [phone]

## 2023-07-20 ENCOUNTER — Other Ambulatory Visit (INDEPENDENT_AMBULATORY_CARE_PROVIDER_SITE_OTHER)

## 2023-07-20 ENCOUNTER — Other Ambulatory Visit (HOSPITAL_COMMUNITY)
Admission: RE | Admit: 2023-07-20 | Discharge: 2023-07-20 | Disposition: A | Discharge status: Home / Self Care | Source: Ambulatory Visit | Attending: Internal Medicine | Admitting: Internal Medicine

## 2023-07-20 DIAGNOSIS — B20 Human immunodeficiency virus [HIV] disease: Secondary | ICD-10-CM

## 2023-07-20 DIAGNOSIS — Z113 Encounter for screening for infections with a predominantly sexual mode of transmission: Secondary | ICD-10-CM | POA: Diagnosis present

## 2023-07-20 DIAGNOSIS — Z79899 Other long term (current) drug therapy: Secondary | ICD-10-CM

## 2023-07-21 LAB — URINE CYTOLOGY ANCILLARY ONLY
Chlamydia: NEGATIVE
Comment: NEGATIVE
Comment: NORMAL
Neisseria Gonorrhea: NEGATIVE

## 2023-07-21 LAB — T-HELPER CELL (CD4) - (RCID CLINIC ONLY)
CD4 % Helper T Cell: 17 % — ABNORMAL LOW (ref 33–65)
CD4 T Cell Abs: 351 /uL — ABNORMAL LOW (ref 400–1790)

## 2023-07-22 LAB — CBC
Hematocrit: 35.9 % — ABNORMAL LOW (ref 37.5–51.0)
Hemoglobin: 11.6 g/dL — ABNORMAL LOW (ref 13.0–17.7)
MCH: 28.8 pg (ref 26.6–33.0)
MCHC: 32.3 g/dL (ref 31.5–35.7)
MCV: 89 fL (ref 79–97)
Platelets: 158 10*3/uL (ref 150–450)
RBC: 4.03 x10E6/uL — ABNORMAL LOW (ref 4.14–5.80)
RDW: 14.5 % (ref 11.6–15.4)
WBC: 4.9 10*3/uL (ref 3.4–10.8)

## 2023-07-22 LAB — CMP14 + ANION GAP
ALT: 11 IU/L (ref 0–44)
AST: 18 IU/L (ref 0–40)
Albumin: 4 g/dL (ref 3.8–4.9)
Alkaline Phosphatase: 85 IU/L (ref 44–121)
Anion Gap: 15 mmol/L (ref 10.0–18.0)
BUN/Creatinine Ratio: 11 (ref 9–20)
BUN: 11 mg/dL (ref 6–24)
Bilirubin Total: 0.3 mg/dL (ref 0.0–1.2)
CO2: 25 mmol/L (ref 20–29)
Calcium: 9.4 mg/dL (ref 8.7–10.2)
Chloride: 102 mmol/L (ref 96–106)
Creatinine, Ser: 1 mg/dL (ref 0.76–1.27)
Globulin, Total: 4.2 g/dL (ref 1.5–4.5)
Glucose: 81 mg/dL (ref 70–99)
Potassium: 3.9 mmol/L (ref 3.5–5.2)
Sodium: 142 mmol/L (ref 134–144)
Total Protein: 8.2 g/dL (ref 6.0–8.5)
eGFR: 89 mL/min/{1.73_m2} (ref >59)

## 2023-07-22 LAB — LIPID PANEL
Chol/HDL Ratio: 2.9 ratio (ref 0.0–5.0)
Cholesterol, Total: 115 mg/dL (ref 100–199)
HDL: 40 mg/dL (ref >39)
LDL Chol Calc (NIH): 62 mg/dL (ref 0–99)
Triglycerides: 57 mg/dL (ref 0–149)
VLDL Cholesterol Cal: 13 mg/dL (ref 5–40)

## 2023-07-22 LAB — HIV-1 RNA QUANT-NO REFLEX-BLD
HIV-1 RNA Viral Load Log: 2.82 {Log_copies}/mL
HIV-1 RNA Viral Load: 660 {copies}/mL

## 2023-07-22 LAB — RPR: RPR Ser Ql: NONREACTIVE

## 2023-07-24 ENCOUNTER — Other Ambulatory Visit: Payer: Self-pay | Admitting: Infectious Diseases

## 2023-07-24 DIAGNOSIS — B20 Human immunodeficiency virus [HIV] disease: Secondary | ICD-10-CM

## 2023-07-24 NOTE — Addendum Note (Signed)
 Addended by: Bufford Spikes on: 07/24/2023 09:59 AM   Modules accepted: Orders

## 2023-07-25 ENCOUNTER — Other Ambulatory Visit: Payer: Self-pay | Admitting: Infectious Diseases

## 2023-07-25 DIAGNOSIS — B20 Human immunodeficiency virus [HIV] disease: Secondary | ICD-10-CM

## 2023-07-25 DIAGNOSIS — Z79899 Other long term (current) drug therapy: Secondary | ICD-10-CM

## 2023-07-25 DIAGNOSIS — Z113 Encounter for screening for infections with a predominantly sexual mode of transmission: Secondary | ICD-10-CM

## 2023-07-26 ENCOUNTER — Other Ambulatory Visit: Payer: Self-pay

## 2023-07-26 ENCOUNTER — Ambulatory Visit: Admitting: Infectious Diseases

## 2023-07-26 VITALS — BP 131/75 | HR 68 | Temp 98.1°F | Resp 28 | Ht 67.0 in | Wt 178.6 lb

## 2023-07-26 DIAGNOSIS — B20 Human immunodeficiency virus [HIV] disease: Secondary | ICD-10-CM

## 2023-07-26 DIAGNOSIS — Z113 Encounter for screening for infections with a predominantly sexual mode of transmission: Secondary | ICD-10-CM

## 2023-07-26 DIAGNOSIS — Z79899 Other long term (current) drug therapy: Secondary | ICD-10-CM

## 2023-07-26 MED ORDER — ONDANSETRON HCL 4 MG PO TABS: 4.0000 mg | ORAL_TABLET | Freq: Three times a day (TID) | ORAL | 1 refills | Status: AC | PRN

## 2023-07-26 MED ORDER — TRIAMCINOLONE ACETONIDE 0.1 % EX CREA: 1.0000 | TOPICAL_CREAM | Freq: Two times a day (BID) | CUTANEOUS | 3 refills | Status: AC

## 2023-07-26 NOTE — Assessment & Plan Note (Addendum)
 He is doing well but still detectable.  No change in medication for now- await newer options (capsid).  He is not a cabaneuva candidate.  Labs prior He defers flu and covid vax as well as colonoscopy Given condoms Will see him back in 6 months.

## 2023-07-26 NOTE — Progress Notes (Signed)
   Subjective:    Patient ID: Dennis Mathis, male  DOB: 1965/06/08, 58 y.o.        MRN: [id number]   HPI 58 yo M dx HIV+ 11-16-05. Had routine test. Previously homeless. Blind in L eye.   Prev taking TRV/ISN. 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 Hep B S Ab+.   At his f/u appt 11-05-13 he had deep sequencing showing multiple drug mutations.  Was changed to darunavir-cobisistat/complera then to odefsy/prezcobix 2018.  Has some occas nausea with his art. He has emesis and loses his art.  Has tried with food. Oatmeal. Crackers has helped.  Has prev on zofran for this.    HIV 1 RNA Quant  Date Value  05/20/2020 3,070 Copies/mL (H)  08/12/2019 6,210 copies/mL (H)  02/06/2017 <20 NOT DETECTED copies/mL   HIV-1 RNA Viral Load (copies/mL)  Date Value  07/20/2023 660   CD4 T Cell Abs (/uL)  Date Value  07/20/2023 351 (L)  11/30/2022 356 (L)  05/20/2020 467     Health Maintenance  Topic Date Due  . DTaP/Tdap/Td (1 - Tdap) Never done  . Zoster Vaccines- Shingrix (1 of 2) Never done  . Colonoscopy  Never done  . COVID-19 Vaccine (3 - Pfizer risk series) 02/14/2020  . INFLUENZA VACCINE  12/15/2022  . Pneumococcal Vaccine 14-81 Years old (4 of 4 - PPSV23 or PCV20) 05/12/2033  . Hepatitis C Screening  Completed  . HIV Screening  Completed  . HPV VACCINES  Aged Out      Review of Systems  Constitutional:  Negative for chills, fever and weight loss.  Respiratory:  Negative for cough and shortness of breath.   Gastrointestinal:  Negative for constipation and diarrhea.  Genitourinary:  Negative for dysuria.  Psychiatric/Behavioral:  Negative for depression.    Please see HPI. All other systems reviewed and negative.     Objective:  Physical Exam Vitals reviewed.  Constitutional:      Appearance: Normal appearance.  HENT:     Mouth/Throat:     Mouth: Mucous membranes are moist.     Pharynx: No oropharyngeal exudate.  Cardiovascular:     Rate and  Rhythm: Normal rate and regular rhythm.  Pulmonary:     Effort: Pulmonary effort is normal.     Breath sounds: Normal breath sounds.  Abdominal:     General: Bowel sounds are normal. There is no distension.     Palpations: Abdomen is soft.     Tenderness: There is no abdominal tenderness.  Musculoskeletal:     Cervical back: Normal range of motion and neck supple.     Right lower leg: No edema.     Left lower leg: No edema.  Neurological:     Mental Status: He is alert.  Psychiatric:        Mood and Affect: Mood normal.          Assessment & Plan:
